# Patient Record
Sex: Male | Born: 1950 | Race: White | Hispanic: No | Smoking: Former smoker
Health system: Southern US, Community
[De-identification: ages and names within clinical notes are randomized; demographics above are authoritative.]

## PROBLEM LIST (undated history)

## (undated) DIAGNOSIS — I252 Old myocardial infarction: Secondary | ICD-10-CM

## (undated) DIAGNOSIS — I951 Orthostatic hypotension: Secondary | ICD-10-CM

## (undated) DIAGNOSIS — I472 Ventricular tachycardia, unspecified: Secondary | ICD-10-CM

## (undated) DIAGNOSIS — I10 Essential (primary) hypertension: Secondary | ICD-10-CM

## (undated) DIAGNOSIS — I219 Acute myocardial infarction, unspecified: Secondary | ICD-10-CM

## (undated) DIAGNOSIS — R911 Solitary pulmonary nodule: Secondary | ICD-10-CM

## (undated) DIAGNOSIS — I739 Peripheral vascular disease, unspecified: Secondary | ICD-10-CM

## (undated) DIAGNOSIS — I255 Ischemic cardiomyopathy: Secondary | ICD-10-CM

## (undated) DIAGNOSIS — Z720 Tobacco use: Secondary | ICD-10-CM

## (undated) DIAGNOSIS — E785 Hyperlipidemia, unspecified: Secondary | ICD-10-CM

## (undated) DIAGNOSIS — J449 Chronic obstructive pulmonary disease, unspecified: Secondary | ICD-10-CM

## (undated) DIAGNOSIS — I2510 Atherosclerotic heart disease of native coronary artery without angina pectoris: Secondary | ICD-10-CM

## (undated) HISTORY — DX: Old myocardial infarction: I25.2

## (undated) HISTORY — DX: Acute myocardial infarction, unspecified: I21.9

## (undated) HISTORY — DX: Ventricular tachycardia, unspecified: I47.20

## (undated) HISTORY — DX: Tobacco use: Z72.0

## (undated) HISTORY — DX: Peripheral vascular disease, unspecified: I73.9

## (undated) HISTORY — DX: Ischemic cardiomyopathy: I25.5

## (undated) HISTORY — DX: Orthostatic hypotension: I95.1

## (undated) HISTORY — DX: Ventricular tachycardia: I47.2

## (undated) HISTORY — PX: CORONARY STENT PLACEMENT: SHX1402

## (undated) HISTORY — DX: Solitary pulmonary nodule: R91.1

## (undated) HISTORY — DX: Atherosclerotic heart disease of native coronary artery without angina pectoris: I25.10

## (undated) HISTORY — DX: Essential (primary) hypertension: I10

## (undated) HISTORY — DX: Hyperlipidemia, unspecified: E78.5

---

## 2002-02-17 HISTORY — PX: ANGIOPLASTY: SHX39

## 2002-12-04 ENCOUNTER — Inpatient Hospital Stay (HOSPITAL_COMMUNITY): Admission: EM | Admit: 2002-12-04 | Discharge: 2002-12-08 | Payer: Self-pay | Admitting: Cardiology

## 2003-12-25 ENCOUNTER — Ambulatory Visit: Payer: Self-pay | Admitting: Family Medicine

## 2004-01-02 ENCOUNTER — Ambulatory Visit: Payer: Self-pay | Admitting: *Deleted

## 2004-02-05 ENCOUNTER — Ambulatory Visit: Payer: Self-pay | Admitting: Cardiology

## 2004-02-27 ENCOUNTER — Ambulatory Visit: Payer: Self-pay | Admitting: Family Medicine

## 2004-05-28 ENCOUNTER — Ambulatory Visit: Payer: Self-pay | Admitting: Family Medicine

## 2007-02-15 ENCOUNTER — Encounter: Payer: Self-pay | Admitting: Cardiology

## 2007-05-22 ENCOUNTER — Encounter: Payer: Self-pay | Admitting: Pulmonary Disease

## 2007-05-27 ENCOUNTER — Ambulatory Visit: Payer: Self-pay | Admitting: Cardiology

## 2007-05-27 ENCOUNTER — Encounter: Payer: Self-pay | Admitting: Cardiology

## 2007-05-31 ENCOUNTER — Ambulatory Visit: Payer: Self-pay | Admitting: Cardiology

## 2007-05-31 ENCOUNTER — Encounter: Payer: Self-pay | Admitting: Pulmonary Disease

## 2007-06-23 ENCOUNTER — Ambulatory Visit: Payer: Self-pay | Admitting: Cardiology

## 2007-06-30 ENCOUNTER — Ambulatory Visit: Payer: Self-pay | Admitting: Cardiology

## 2007-07-06 ENCOUNTER — Encounter: Payer: Self-pay | Admitting: Pulmonary Disease

## 2007-07-21 ENCOUNTER — Ambulatory Visit: Payer: Self-pay | Admitting: Cardiology

## 2007-07-23 ENCOUNTER — Ambulatory Visit: Payer: Self-pay | Admitting: Cardiology

## 2007-07-26 ENCOUNTER — Encounter: Payer: Self-pay | Admitting: Cardiology

## 2007-07-27 ENCOUNTER — Ambulatory Visit: Payer: Self-pay | Admitting: Pulmonary Disease

## 2007-07-27 DIAGNOSIS — J984 Other disorders of lung: Secondary | ICD-10-CM | POA: Insufficient documentation

## 2007-07-27 DIAGNOSIS — J45909 Unspecified asthma, uncomplicated: Secondary | ICD-10-CM | POA: Insufficient documentation

## 2007-07-27 DIAGNOSIS — J449 Chronic obstructive pulmonary disease, unspecified: Secondary | ICD-10-CM

## 2007-07-27 DIAGNOSIS — F17200 Nicotine dependence, unspecified, uncomplicated: Secondary | ICD-10-CM

## 2007-07-27 DIAGNOSIS — I10 Essential (primary) hypertension: Secondary | ICD-10-CM | POA: Insufficient documentation

## 2007-07-27 DIAGNOSIS — I219 Acute myocardial infarction, unspecified: Secondary | ICD-10-CM | POA: Insufficient documentation

## 2007-07-28 ENCOUNTER — Ambulatory Visit: Payer: Self-pay | Admitting: Pulmonary Disease

## 2007-07-29 ENCOUNTER — Ambulatory Visit (HOSPITAL_COMMUNITY): Admission: RE | Admit: 2007-07-29 | Discharge: 2007-07-29 | Payer: Self-pay | Admitting: Cardiology

## 2007-07-29 ENCOUNTER — Inpatient Hospital Stay (HOSPITAL_BASED_OUTPATIENT_CLINIC_OR_DEPARTMENT_OTHER): Admission: RE | Admit: 2007-07-29 | Discharge: 2007-07-29 | Payer: Self-pay | Admitting: Cardiology

## 2007-07-29 ENCOUNTER — Ambulatory Visit: Payer: Self-pay | Admitting: Cardiology

## 2007-08-03 ENCOUNTER — Ambulatory Visit: Payer: Self-pay | Admitting: Cardiology

## 2007-08-04 ENCOUNTER — Telehealth (INDEPENDENT_AMBULATORY_CARE_PROVIDER_SITE_OTHER): Payer: Self-pay | Admitting: *Deleted

## 2007-11-14 ENCOUNTER — Encounter: Payer: Self-pay | Admitting: Cardiology

## 2007-11-19 ENCOUNTER — Emergency Department (HOSPITAL_COMMUNITY): Admission: EM | Admit: 2007-11-19 | Discharge: 2007-11-19 | Payer: Self-pay | Admitting: Emergency Medicine

## 2007-11-19 ENCOUNTER — Encounter: Payer: Self-pay | Admitting: Cardiology

## 2007-11-22 ENCOUNTER — Ambulatory Visit: Payer: Self-pay | Admitting: Pulmonary Disease

## 2007-11-22 ENCOUNTER — Inpatient Hospital Stay (HOSPITAL_COMMUNITY): Admission: AD | Admit: 2007-11-22 | Discharge: 2007-11-27 | Payer: Self-pay | Admitting: Pulmonary Disease

## 2007-11-22 ENCOUNTER — Ambulatory Visit: Payer: Self-pay | Admitting: Internal Medicine

## 2007-11-24 ENCOUNTER — Telehealth (INDEPENDENT_AMBULATORY_CARE_PROVIDER_SITE_OTHER): Payer: Self-pay | Admitting: *Deleted

## 2007-11-26 ENCOUNTER — Encounter: Payer: Self-pay | Admitting: Pulmonary Disease

## 2007-12-02 ENCOUNTER — Telehealth (INDEPENDENT_AMBULATORY_CARE_PROVIDER_SITE_OTHER): Payer: Self-pay | Admitting: *Deleted

## 2007-12-03 ENCOUNTER — Ambulatory Visit: Payer: Self-pay | Admitting: Pulmonary Disease

## 2007-12-03 DIAGNOSIS — J441 Chronic obstructive pulmonary disease with (acute) exacerbation: Secondary | ICD-10-CM

## 2007-12-22 ENCOUNTER — Ambulatory Visit: Payer: Self-pay | Admitting: Pulmonary Disease

## 2007-12-22 DIAGNOSIS — F411 Generalized anxiety disorder: Secondary | ICD-10-CM | POA: Insufficient documentation

## 2007-12-23 ENCOUNTER — Encounter: Payer: Self-pay | Admitting: Pulmonary Disease

## 2008-01-19 ENCOUNTER — Encounter: Payer: Self-pay | Admitting: Cardiology

## 2008-01-19 ENCOUNTER — Ambulatory Visit: Payer: Self-pay | Admitting: Cardiology

## 2008-01-19 DIAGNOSIS — I2510 Atherosclerotic heart disease of native coronary artery without angina pectoris: Secondary | ICD-10-CM

## 2008-01-19 DIAGNOSIS — I739 Peripheral vascular disease, unspecified: Secondary | ICD-10-CM | POA: Insufficient documentation

## 2008-03-06 ENCOUNTER — Encounter: Payer: Self-pay | Admitting: Pulmonary Disease

## 2008-03-23 ENCOUNTER — Ambulatory Visit: Payer: Self-pay | Admitting: Internal Medicine

## 2008-04-04 ENCOUNTER — Telehealth (INDEPENDENT_AMBULATORY_CARE_PROVIDER_SITE_OTHER): Payer: Self-pay | Admitting: *Deleted

## 2008-04-05 ENCOUNTER — Ambulatory Visit: Payer: Self-pay | Admitting: Internal Medicine

## 2008-04-12 ENCOUNTER — Telehealth: Payer: Self-pay | Admitting: Pulmonary Disease

## 2008-04-27 ENCOUNTER — Ambulatory Visit: Payer: Self-pay | Admitting: Cardiovascular Disease

## 2008-05-04 ENCOUNTER — Ambulatory Visit: Payer: Self-pay | Admitting: Pulmonary Disease

## 2008-05-08 ENCOUNTER — Telehealth (INDEPENDENT_AMBULATORY_CARE_PROVIDER_SITE_OTHER): Payer: Self-pay | Admitting: *Deleted

## 2008-07-26 ENCOUNTER — Telehealth (INDEPENDENT_AMBULATORY_CARE_PROVIDER_SITE_OTHER): Payer: Self-pay | Admitting: *Deleted

## 2008-09-01 ENCOUNTER — Ambulatory Visit: Payer: Self-pay | Admitting: Cardiology

## 2008-09-25 ENCOUNTER — Ambulatory Visit: Payer: Self-pay | Admitting: Pulmonary Disease

## 2008-10-20 ENCOUNTER — Encounter: Payer: Self-pay | Admitting: Cardiology

## 2008-10-24 ENCOUNTER — Encounter: Payer: Self-pay | Admitting: Pulmonary Disease

## 2008-10-25 ENCOUNTER — Telehealth: Payer: Self-pay | Admitting: Pulmonary Disease

## 2008-11-13 ENCOUNTER — Telehealth: Payer: Self-pay | Admitting: Adult Health

## 2008-11-20 ENCOUNTER — Ambulatory Visit: Payer: Self-pay | Admitting: Cardiology

## 2008-11-20 DIAGNOSIS — I714 Abdominal aortic aneurysm, without rupture, unspecified: Secondary | ICD-10-CM | POA: Insufficient documentation

## 2008-11-22 ENCOUNTER — Encounter: Payer: Self-pay | Admitting: Cardiology

## 2009-01-01 ENCOUNTER — Encounter (INDEPENDENT_AMBULATORY_CARE_PROVIDER_SITE_OTHER): Payer: Self-pay | Admitting: *Deleted

## 2009-01-22 ENCOUNTER — Telehealth (INDEPENDENT_AMBULATORY_CARE_PROVIDER_SITE_OTHER): Payer: Self-pay | Admitting: *Deleted

## 2009-01-22 ENCOUNTER — Telehealth: Payer: Self-pay | Admitting: Pulmonary Disease

## 2009-01-23 ENCOUNTER — Ambulatory Visit: Payer: Self-pay | Admitting: Internal Medicine

## 2009-02-19 ENCOUNTER — Encounter: Payer: Self-pay | Admitting: Pulmonary Disease

## 2009-02-22 ENCOUNTER — Ambulatory Visit: Payer: Self-pay | Admitting: Pulmonary Disease

## 2009-02-23 ENCOUNTER — Telehealth (INDEPENDENT_AMBULATORY_CARE_PROVIDER_SITE_OTHER): Payer: Self-pay | Admitting: *Deleted

## 2009-02-27 ENCOUNTER — Encounter: Payer: Self-pay | Admitting: Cardiology

## 2009-03-02 ENCOUNTER — Telehealth: Payer: Self-pay | Admitting: Pulmonary Disease

## 2009-03-06 ENCOUNTER — Encounter (INDEPENDENT_AMBULATORY_CARE_PROVIDER_SITE_OTHER): Payer: Self-pay | Admitting: *Deleted

## 2009-03-09 ENCOUNTER — Ambulatory Visit: Payer: Self-pay | Admitting: Cardiology

## 2009-03-13 ENCOUNTER — Encounter: Payer: Self-pay | Admitting: Pulmonary Disease

## 2009-03-16 ENCOUNTER — Ambulatory Visit: Payer: Self-pay | Admitting: Pulmonary Disease

## 2009-03-22 ENCOUNTER — Telehealth (INDEPENDENT_AMBULATORY_CARE_PROVIDER_SITE_OTHER): Payer: Self-pay | Admitting: *Deleted

## 2009-04-23 ENCOUNTER — Telehealth (INDEPENDENT_AMBULATORY_CARE_PROVIDER_SITE_OTHER): Payer: Self-pay | Admitting: *Deleted

## 2009-04-24 ENCOUNTER — Telehealth (INDEPENDENT_AMBULATORY_CARE_PROVIDER_SITE_OTHER): Payer: Self-pay | Admitting: *Deleted

## 2009-06-11 ENCOUNTER — Ambulatory Visit: Payer: Self-pay | Admitting: Cardiology

## 2009-06-19 ENCOUNTER — Telehealth: Payer: Self-pay | Admitting: Pulmonary Disease

## 2009-06-21 ENCOUNTER — Ambulatory Visit: Payer: Self-pay | Admitting: Pulmonary Disease

## 2009-06-28 ENCOUNTER — Ambulatory Visit (HOSPITAL_COMMUNITY): Admission: RE | Admit: 2009-06-28 | Discharge: 2009-06-28 | Payer: Self-pay | Admitting: Pulmonary Disease

## 2009-07-06 ENCOUNTER — Ambulatory Visit: Payer: Self-pay | Admitting: Pulmonary Disease

## 2009-07-31 ENCOUNTER — Ambulatory Visit: Payer: Self-pay | Admitting: Cardiology

## 2009-08-21 ENCOUNTER — Telehealth (INDEPENDENT_AMBULATORY_CARE_PROVIDER_SITE_OTHER): Payer: Self-pay | Admitting: *Deleted

## 2009-08-23 ENCOUNTER — Encounter: Payer: Self-pay | Admitting: Pulmonary Disease

## 2009-09-25 ENCOUNTER — Ambulatory Visit: Payer: Self-pay | Admitting: Pulmonary Disease

## 2009-09-25 ENCOUNTER — Telehealth (INDEPENDENT_AMBULATORY_CARE_PROVIDER_SITE_OTHER): Payer: Self-pay | Admitting: *Deleted

## 2009-10-02 ENCOUNTER — Telehealth (INDEPENDENT_AMBULATORY_CARE_PROVIDER_SITE_OTHER): Payer: Self-pay | Admitting: *Deleted

## 2009-11-05 ENCOUNTER — Ambulatory Visit: Payer: Self-pay | Admitting: Pulmonary Disease

## 2009-11-26 IMAGING — CT CT ANGIO CHEST
2 of 7 series · 19 of 36 positions shown · IV contrast (APPLIED)
Comparison: Chest radiograph 11/22/2007 and 11/19/2007.

CLINICAL DATA: Worsening shortness of breath.  COPD.  Cough and
wheezing.  History pulmonary nodules.  The

CT ANGIOGRAPHY CHEST
TECHNIQUE: Multidetector CT imaging of the chest using the
standard protocol during bolus administration of intravenous
contrast. Multiplanar reconstructed images including MIPs were
obtained and reviewed to evaluate the vascular anatomy.
Contrast:  68 ml Omnipaque 300

[Series 9: pulm embolism 1.0 b25f thins · axial · 0.64mm/px · z∈[-259,+14]mm · 18 of 305 slices shown]
[im 16/305  lung]
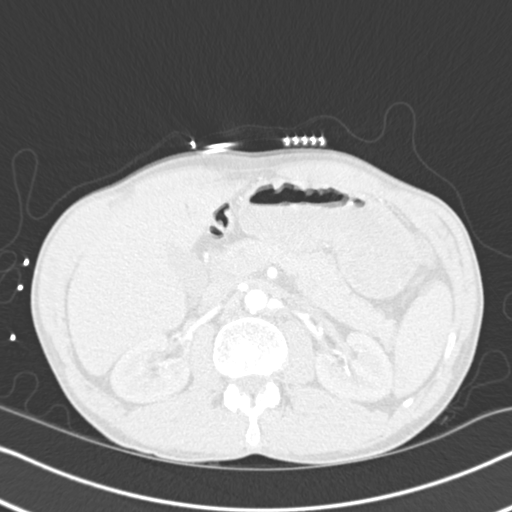
[im 31/305  mediastinal]
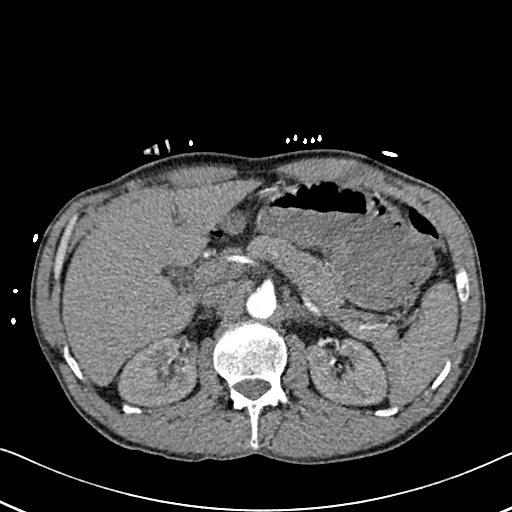
[im 46/305  lung]
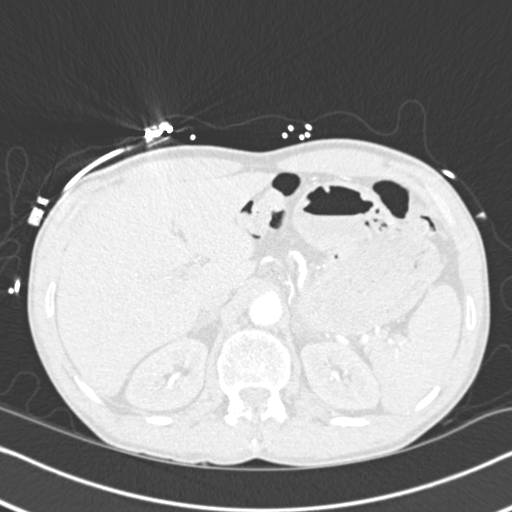
[im 61/305  mediastinal]
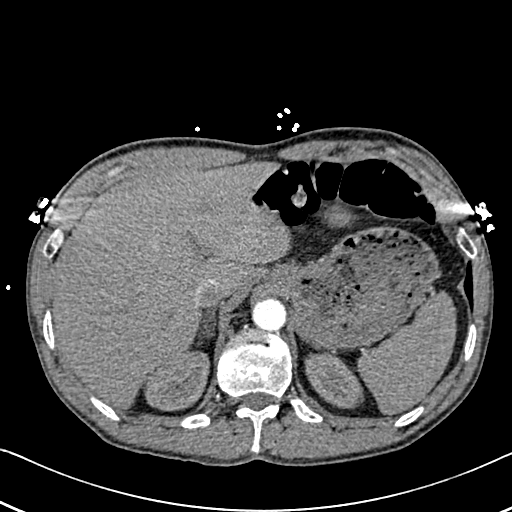
[im 77/305  lung]
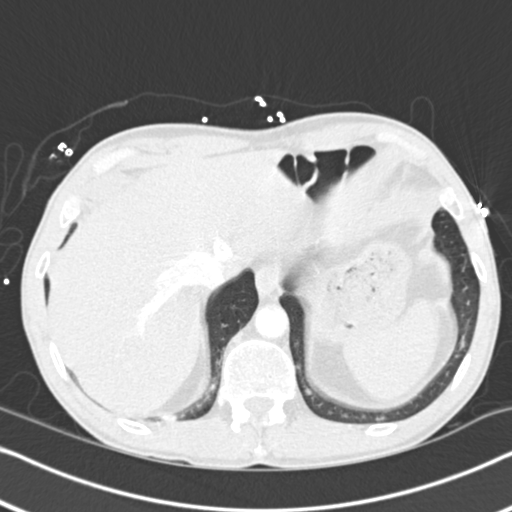
[im 92/305  mediastinal]
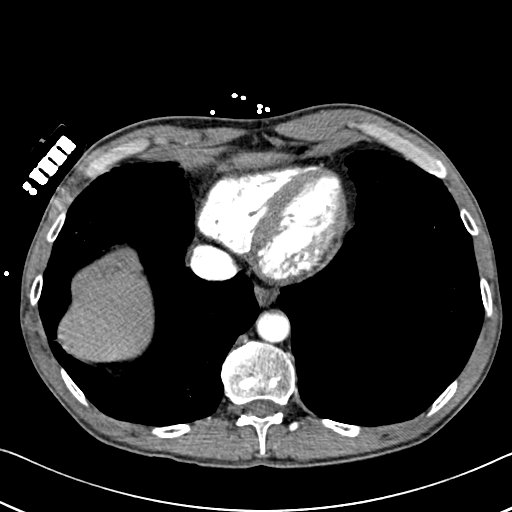
[im 107/305  lung]
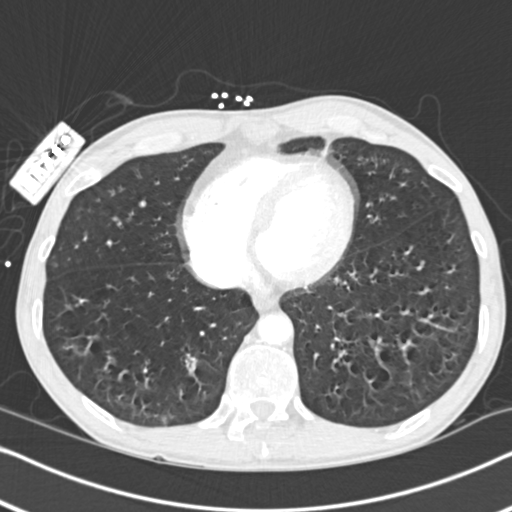
[im 122/305  mediastinal]
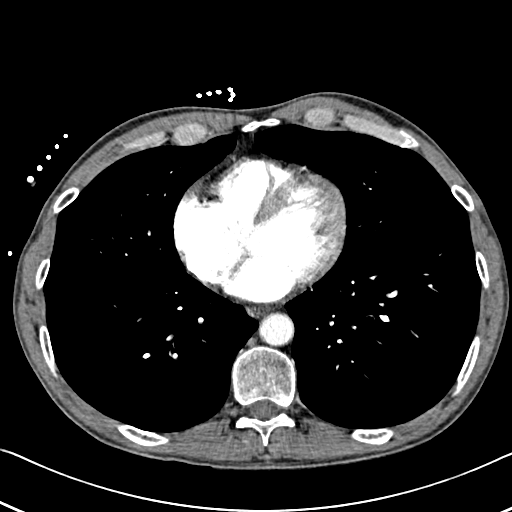
[im 137/305  lung]
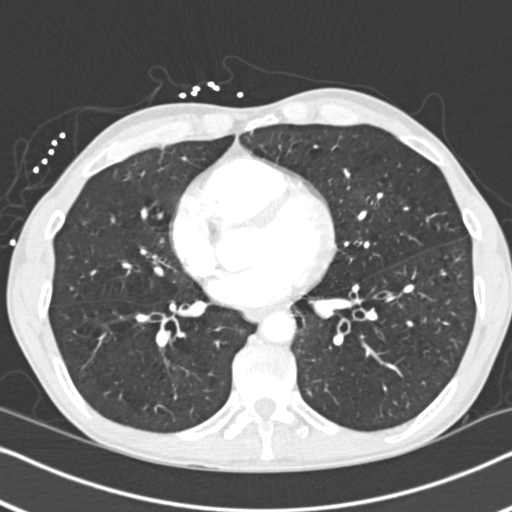
[im 168/305  mediastinal]
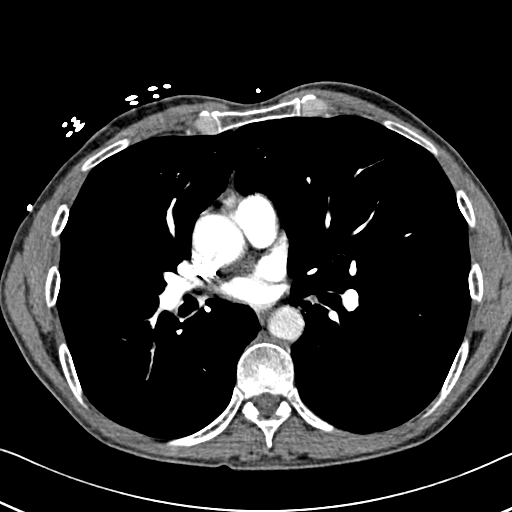
[im 183/305  lung]
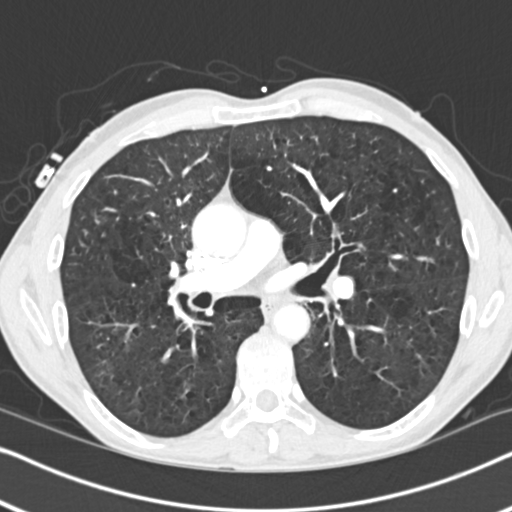
[im 198/305  mediastinal]
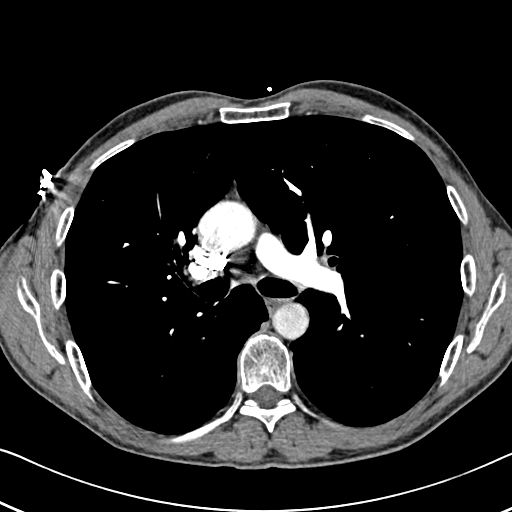
[im 213/305  lung]
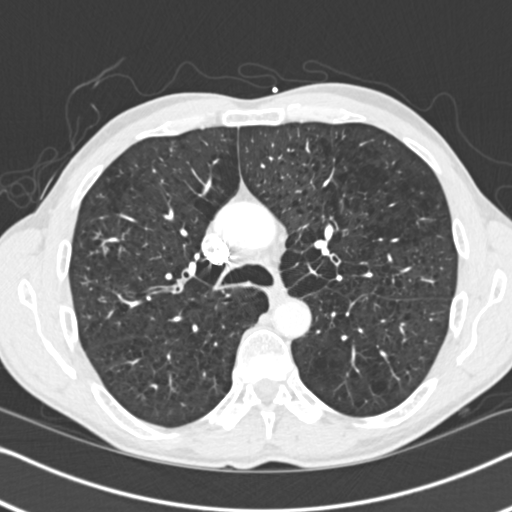
[im 229/305  mediastinal]
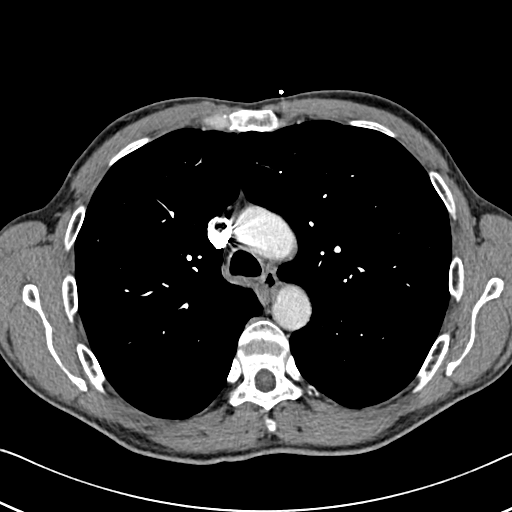
[im 244/305  lung]
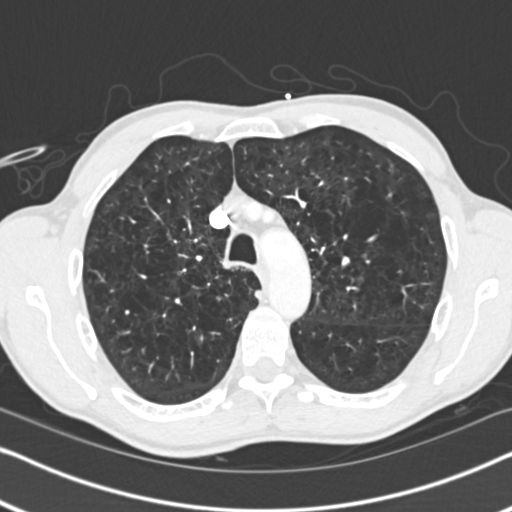
[im 259/305  mediastinal]
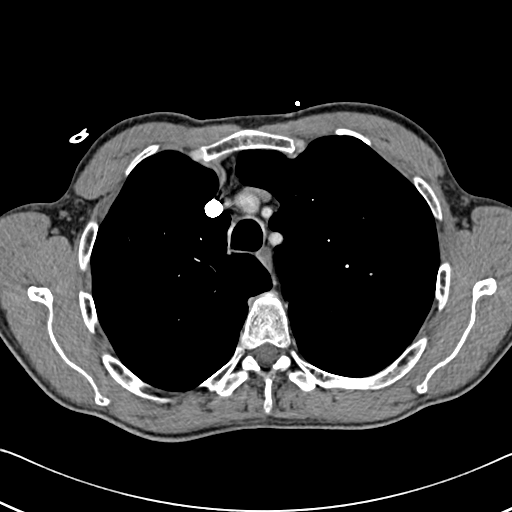
[im 274/305  lung]
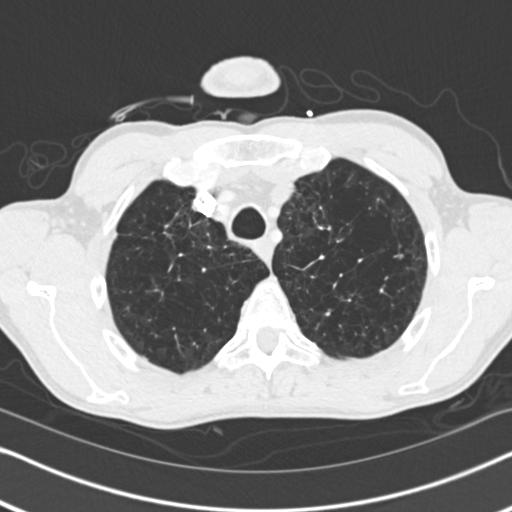
[im 289/305  mediastinal]
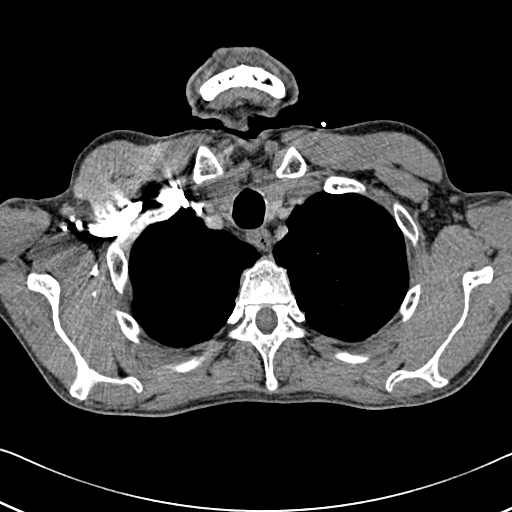

[Series 10: pulm embolism 2.0 cor · coronal · 0.64mm/px · 1 of 108 slices shown]
[im 54/108  mediastinal]
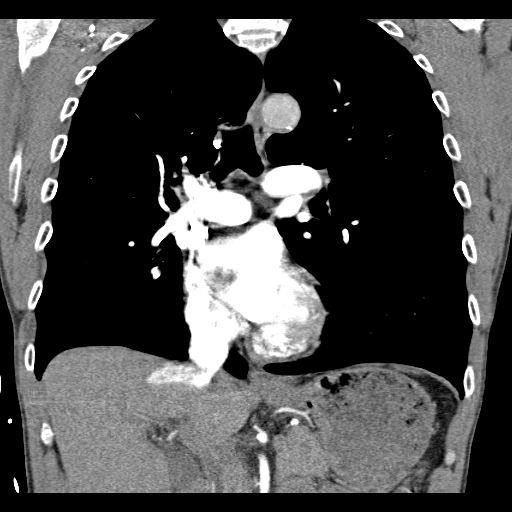

[19 of 36 positions shown; findings below may reference images not displayed]

FINDINGS: There is excellent opacification of the pulmonary
arterial system.  There are no focal filling defects are present to
suggest pulmonary embolus.  Heart size is normal.  There is no
significant pleural or pericardial effusion.  Limited imaging of
the upper abdomen is unremarkable.  Changes of centrilobular
emphysema are noted bilaterally.  There is some breathing motion at
the inferior aspect of the study.  Minimal nodularity at the
inferior aspect of the right upper lobe with a tree in bud
configuration and a similar pattern in the right lower lobe suggest
the possibility of early infection or inflammation.  No focal
disease is evident on the left.
IMPRESSION: 1.  No pulmonary embolus.
2.  Changes of moderate to severe COPD.
3.  Peripheral micronodularity in a "tree in bud" configuration
suggesting early infection or inflammation.

## 2009-11-26 IMAGING — CR DG CHEST 2V
2 series · 2 of 2 positions shown · non-contrast
Comparison: 11/19/2007

CLINICAL DATA: COPD

CHEST - 2 VIEW

[w chest pa]
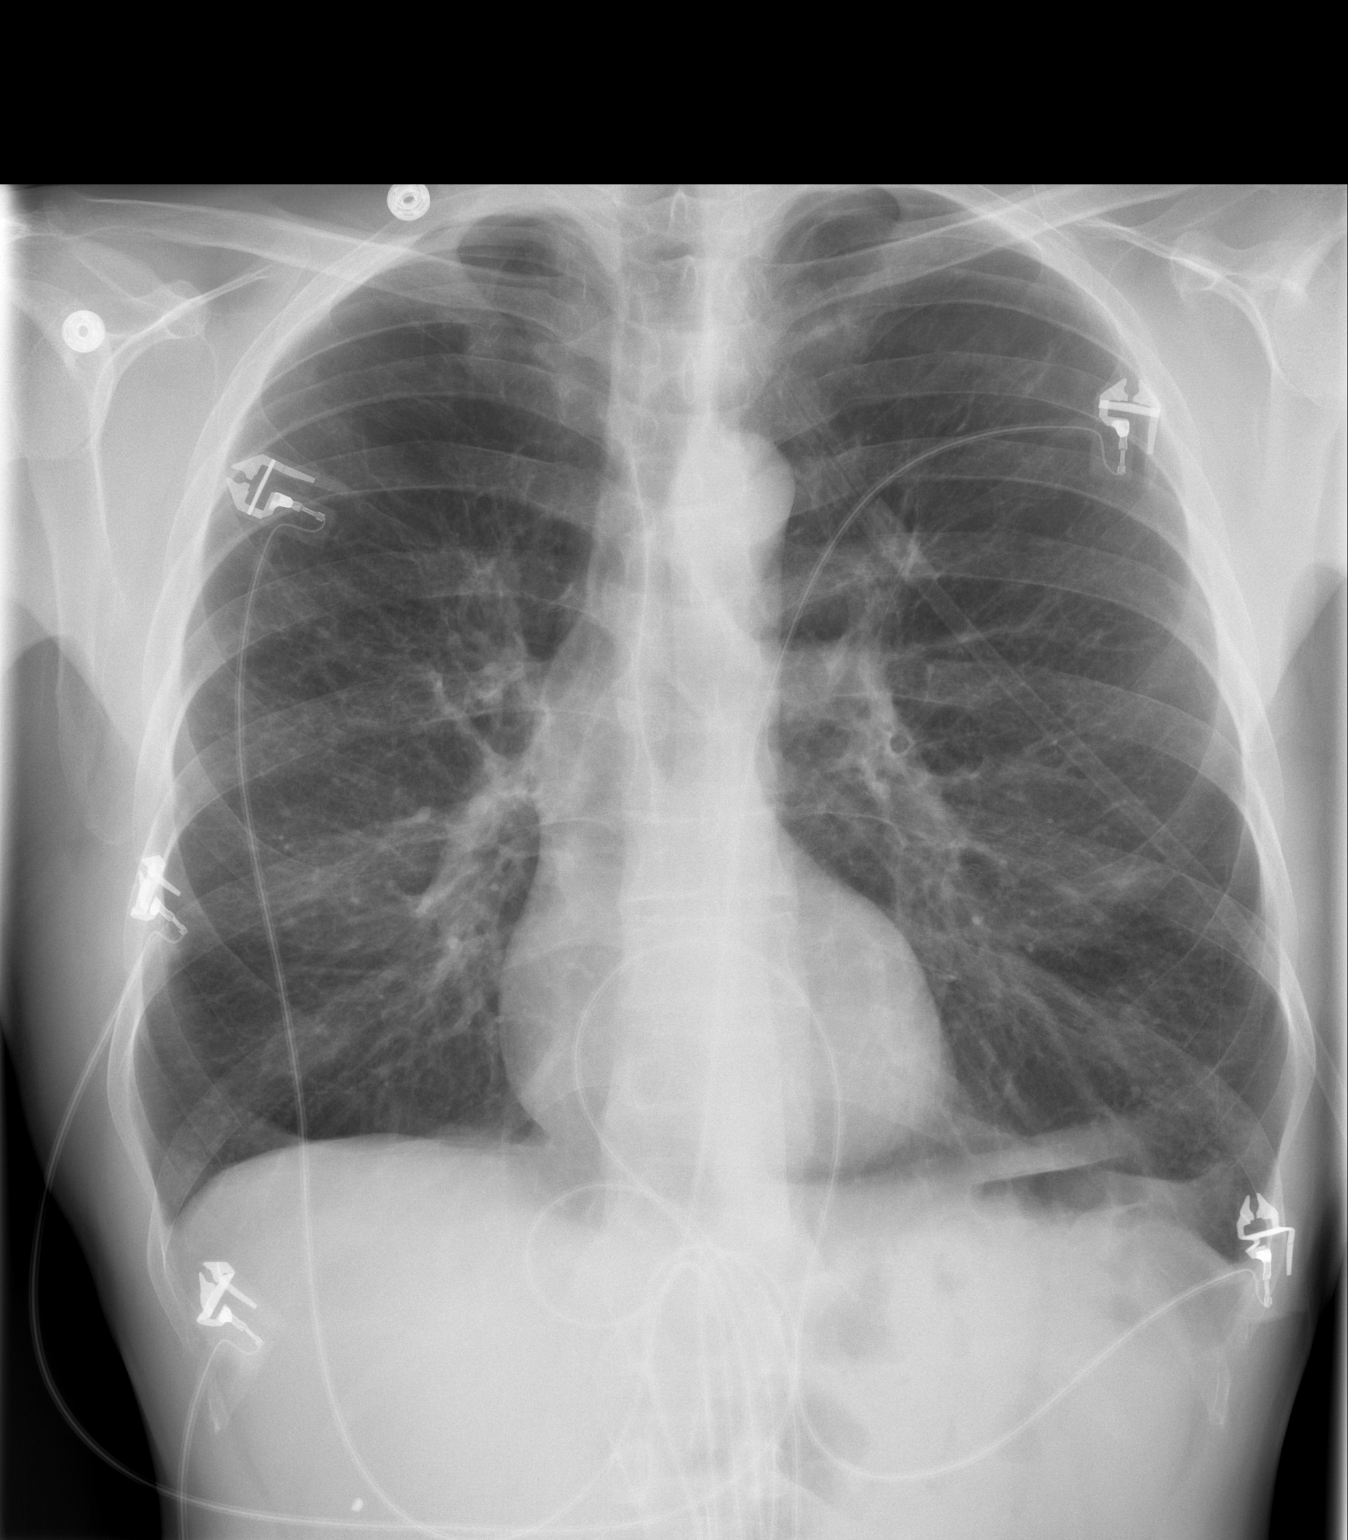

[w chest lat]
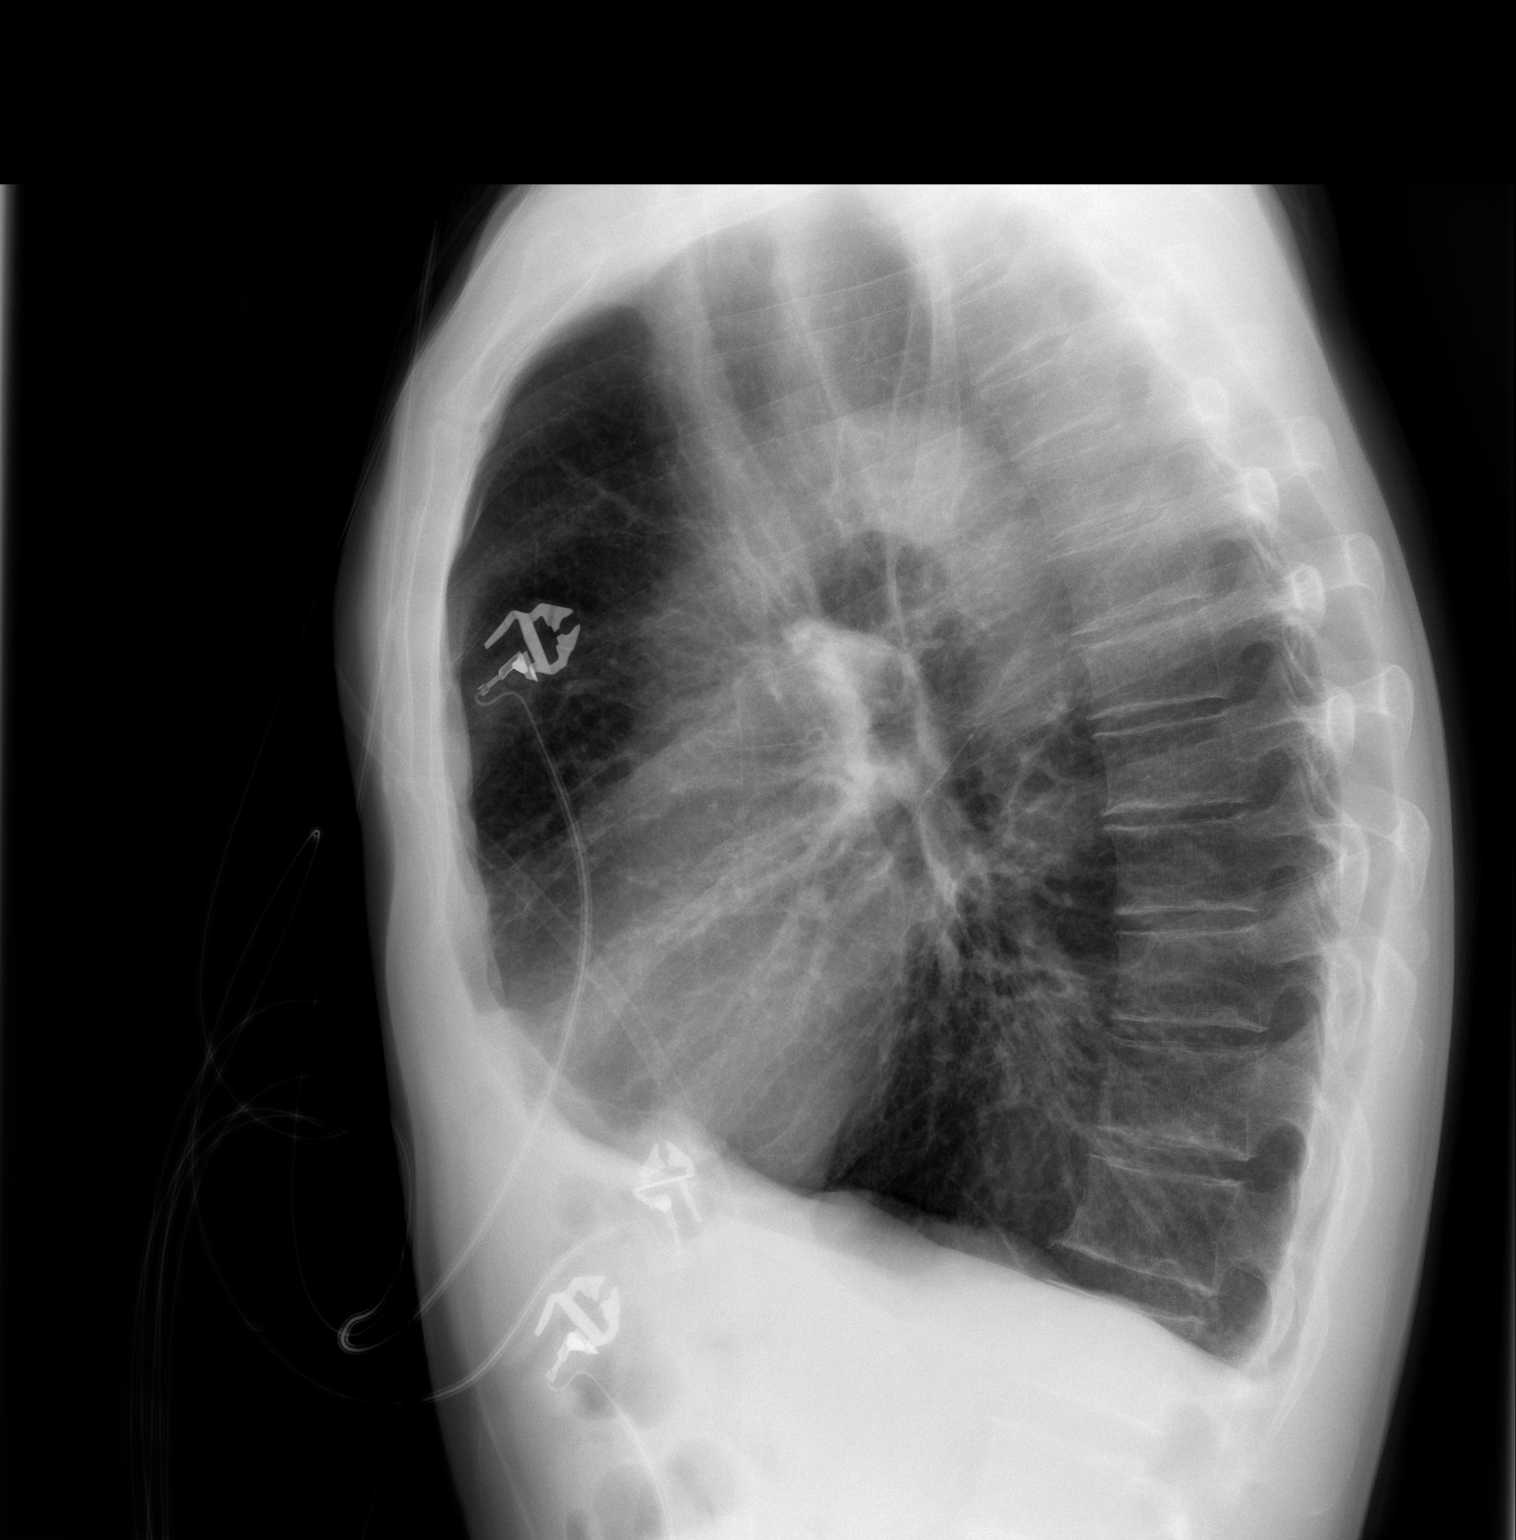

[2 of 2 positions shown; findings below may reference images not displayed]

FINDINGS: The cardiomediastinal silhouette is stable.  No acute
infiltrate or edema.  Hyperinflation again noted.  Left basilar
scarring is stable.  Mild central increased bronchial markings
stable from prior exam.
IMPRESSION: No acute disease.  Stable hyperinflation.

## 2009-12-18 ENCOUNTER — Ambulatory Visit: Payer: Self-pay | Admitting: Cardiovascular Disease

## 2009-12-19 ENCOUNTER — Encounter: Payer: Self-pay | Admitting: Pulmonary Disease

## 2009-12-24 ENCOUNTER — Ambulatory Visit: Payer: Self-pay | Admitting: Pulmonary Disease

## 2010-01-11 ENCOUNTER — Ambulatory Visit: Payer: Self-pay | Admitting: Cardiology

## 2010-02-20 ENCOUNTER — Telehealth (INDEPENDENT_AMBULATORY_CARE_PROVIDER_SITE_OTHER): Payer: Self-pay | Admitting: *Deleted

## 2010-03-09 ENCOUNTER — Encounter: Payer: Self-pay | Admitting: Family Medicine

## 2010-03-14 ENCOUNTER — Other Ambulatory Visit: Payer: Self-pay | Admitting: Pulmonary Disease

## 2010-03-14 DIAGNOSIS — R911 Solitary pulmonary nodule: Secondary | ICD-10-CM

## 2010-03-19 NOTE — Progress Notes (Signed)
Summary: prescript  Phone Note From Pharmacy Call back at [phone]   Caller: [facility]* Call For: alva  Summary of Call: need to verify change in albertol prescript Initial call taken by: Rickard Patience,  April 23, 2009 3:38 PM  Follow-up for Phone Call        The wrong albuterol sulfate dosage sent to pharmacy. We will correct this on the pt's med list. Pharmacy is aware this should be 0.083 albuterol neb sol. Follow-up by: Michel Bickers CMA,  April 23, 2009 4:14 PM    New/Updated Medications: ALBUTEROL SULFATE (2.5 MG/3ML) 0.083% NEBU (ALBUTEROL SULFATE) 1 vial 4-5 times daily in nebulizer

## 2010-03-19 NOTE — Progress Notes (Signed)
Summary: albuterol prescription  Phone Note Call from Patient   Caller: Patient Call For: Aaron [name] Summary of Call: need refill for albuterol [facility] Initial call taken by: Rickard Patience,  April 24, 2009 9:37 AM  Follow-up for Phone Call        see phone note from yesterday ( 04-23-2009) rx already ready at [facility] for pt to pick up.   LMOM informing pt rx sent to pharmacy. Arman Filter LPN  April 24, 1608 9:50 AM

## 2010-03-19 NOTE — Progress Notes (Signed)
Summary: still sob   [id number]  Phone Note Call from Patient Call back at [phone]   Caller: Patient Call For: Vassie Loll - Tammy P Reason for Call: Talk to Nurse Summary of Call: still has wheezing and rattling in chest, sob, seems to have gotten a little better but still bad.  Please advise Initial call taken by: Eugene Gavia,  October 02, 2009 9:42 AM  Follow-up for Phone Call        [facility].  Aundra Millet Reynolds LPN  October 02, 2009 9:45 AM   pt called back.  pt was just seen by TP for a sick visit on 09-25-2009.  Pt states he finished abx and pred taper is is "no better."  Pt c/o sob with exertion, tightness in chest and coughing up clear to brown colored sputum.  pt denied fever, chills, sweats, nausea, vomiting.  pt states he "usually gets a stronger course of pred taper" which helps his breathing.  Will forward message to RA to address.  Aundra Millet Reynolds LPN  October 02, 2009 4:32 PM   Additional Follow-up for Phone Call Additional follow up Details #1::        Tammy, can you pl address - OK for longer steroid taper - stay at 40 x 5 ds, then slow taper  prednisone 10mg  #60 40mg  once daily x 5 days, 30 once daily x5 days, 20mg  once daily x 5 days,  10mg  once daily x 5days, then stop  Please contact office for sooner follow up if symptoms do not improve or worsen  Additional Follow-up by: Comer Locket. Vassie Loll MD,  October 02, 2009 4:37 PM    Additional Follow-up for Phone Call Additional follow up Details #2::    rx sent to pharmacy.  pt aware.  Aundra Millet Reynolds LPN  October 02, 2009 5:04 PM   New/Updated Medications: PREDNISONE 10 MG TABS (PREDNISONE) 4 tabs daily x 5 days, 3 tabs daily x 5 days, 2 tabs daily x 5 days, 1 tab daily x 5 day then stop. Prescriptions: PREDNISONE 10 MG TABS (PREDNISONE) 4 tabs daily x 5 days, 3 tabs daily x 5 days, 2 tabs daily x 5 days, 1 tab daily x 5 day then stop.  #60 x 0   Entered by:   Arman Filter LPN   Authorized by:   Comer Locket. Vassie Loll MD   Signed by:   Arman Filter LPN on 45/40/9811   Method used:   Electronically to        [facility]* (retail)       [address]       [place], [place]  [address]       Ph: [phone]       Fax: [phone]   RxID:   [id number]

## 2010-03-19 NOTE — Assessment & Plan Note (Signed)
Summary: NP follow up - COPD   Copy to:  Dr.Degent Primary Provider/Referring Provider:  [facility]  CC:  1 month follow up - staes has improved .  History of Present Illness: 60/F, ex- smoker for FU of pulm nodules & COPD Gold stg 3 -FEV1 31% 1/11  CT chest>> stable rt nodules, new LUL 3.1 cm patchy density, 8mm new lUL subpleural nodule 4/11 1.6 cm LUL spiculation, PET SUV 2.0 On xanax for anxiety. 2 acute visits in dec 2010.  March 16, 2009 2:25 PM  TP changed lisinopril to benicar. Went to ER in [place] because of "panic attacks."  states he is still having thick mucus that is really hard to get up. Given steroid course.  Jul 06, 2009 4:44 PM  -with daughter to discuss results of PET  Stable on same dose of xanax, off prednisone, c/o runny nose. Breathing close to baseline.   September 25, 2009--Presents for an acute office visit. Complains of increased SOB, prod cough with brown to clear mucus, wheezing x1week . cough is getting worse. Now getting up thick yellow and brown mucus.    November 05, 2009 --Presents for 1 month follow up - staes has improved. Last visit tx for bronchitic flare w/ Augmentin and steroid taper. He is back to baseline. Cough and congestion is resolved. No wheezing. Takes meds regularly. Denies chest pain,  orthopnea, hemoptysis, fever, n/v/d, edema, headache. Has upcoming follow up CT chest on 12/18/09 for lung nodules.   Medications Prior to Update: 1)  Plavix 75 Mg  Tabs (Clopidogrel Bisulfate) .... Take One Tablet By Mouth Once Daily 2)  Ventolin Hfa 108 (90 Base) Mcg/act  Aers (Albuterol Sulfate) .Marland Kitchen.. 1-2 Puffs Every 4-6 Hours As Needed 3)  Bayer Aspirin 325 Mg Tabs (Aspirin) .... Take 1 Tablet By Mouth Once A Day 4)  Advair Diskus 250-50 Mcg/dose  Misc (Fluticasone-Salmeterol) .... Inhale 1 Puff Twice Daily. Rinse Mouth After Use. 5)  Albuterol Sulfate (2.5 Mg/74ml) 0.083% Nebu (Albuterol Sulfate) .Marland Kitchen.. 1 Vial Two Times Daily in Nebulizer 6)   Benicar 20 Mg Tabs (Olmesartan Medoxomil) .... 1/2 Once Daily 7)  Bisoprolol Fumarate 5 Mg Tabs (Bisoprolol Fumarate) .... 1/2 Tab By Mouth Once Daily 8)  Oxygen .... 2l As Needed (Ahc) 9)  Alprazolam 0.5 Mg Tabs (Alprazolam) .... Take One Tablet Every 12 Hours As Needed 10)  Spiriva Handihaler 18 Mcg Caps (Tiotropium Bromide Monohydrate) .Marland Kitchen.. 1 Puff Once Daily 11)  Nitrostat 0.4 Mg Subl (Nitroglycerin) .Marland Kitchen.. 1 Tablet Under Tongue At Onset of Chest Pain; You May Repeat Every 5 Minutes For Up To 3 Doses. 12)  Red Yeast Rice 600 Mg Tabs (Red Yeast Rice Extract) .... Take 1 Tablet By Mouth Once A Day 13)  Cetirizine Hcl 10 Mg Tabs (Cetirizine Hcl) .... Take 1 Tablet By Mouth Once A Day As Needed Allergies 14)  Augmentin 875-125 Mg Tabs (Amoxicillin-Pot Clavulanate) .Marland Kitchen.. 1 By Mouth Two Times A Day 15)  Prednisone 10 Mg Tabs (Prednisone) .... 4 Tabs Daily X 5 Days, 3 Tabs Daily X 5 Days, 2 Tabs Daily X 5 Days, 1 Tab Daily X 5 Day Then Stop.  Current Medications (verified): 1)  Plavix 75 Mg  Tabs (Clopidogrel Bisulfate) .... Take One Tablet By Mouth Once Daily 2)  Ventolin Hfa 108 (90 Base) Mcg/act  Aers (Albuterol Sulfate) .Marland Kitchen.. 1-2 Puffs Every 4-6 Hours As Needed 3)  Bayer Aspirin 325 Mg Tabs (Aspirin) .... Take 1 Tablet By Mouth Once A Day 4)  Advair Diskus 250-50 Mcg/dose  Misc (Fluticasone-Salmeterol) .... Inhale 1 Puff Twice Daily. Rinse Mouth After Use. 5)  Albuterol Sulfate (2.5 Mg/13ml) 0.083% Nebu (Albuterol Sulfate) .Marland Kitchen.. 1 Vial Two Times Daily in Nebulizer 6)  Benicar 20 Mg Tabs (Olmesartan Medoxomil) .... 1/2 Once Daily 7)  Bisoprolol Fumarate 5 Mg Tabs (Bisoprolol Fumarate) .... 1/2 Tab By Mouth Once Daily 8)  Oxygen .... 2l As Needed (Ahc) 9)  Alprazolam 0.5 Mg Tabs (Alprazolam) .... Take One Tablet Every 12 Hours As Needed 10)  Spiriva Handihaler 18 Mcg Caps (Tiotropium Bromide Monohydrate) .Marland Kitchen.. 1 Puff Once Daily 11)  Nitrostat 0.4 Mg Subl (Nitroglycerin) .Marland Kitchen.. 1 Tablet Under Tongue At  Onset of Chest Pain; You May Repeat Every 5 Minutes For Up To 3 Doses. 12)  Red Yeast Rice 600 Mg Tabs (Red Yeast Rice Extract) .... Take 1 Tablet By Mouth Once A Day 13)  Cetirizine Hcl 10 Mg Tabs (Cetirizine Hcl) .... 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 Coronary artery disease severe single-vessel coronary arteries with mildly reduced ejection fraction EF 45% significant for her wall motion abnormality.  Chronically occluded right coronary artery.  1. Severe ischemic cardiomyopathy.     a.     EF 40-45% with inferior apical akinesis by 2-D echo, April      2009.     b.     EF 30-35% by cardiac catheterization in 2004.     c.     Status post NSTEMI/bare-metal stenting of proximal LAD,      October 2004.     d.     Residual, chronic 100% distal RCA stenosis.     e.     Status post MI/PCI in 1989; HSRA/stenting in 1997 [facility]). 2. History of nonsustained ventricular tachycardia.     a.     Noninducible electrophysiology study in 2004. 3. Severe COPD/bullous emphysema.     a.     Longstanding tobacco smoking. 4. Pulmonary nodules.     a.     Confirmed by recent follow up chest CT scan. 5. Peripheral vascular disease.     a.     LLE claudication with 0.90 ABI; 0.94 on the right. 6. History of orthostatic hypotension. 7. Dyslipidemia.   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  Patient states former smoker.   Risk Factors: Smoking Status: quit (09/25/2009) Packs/Day: <1/3 PPD (07/31/2009)  Past Pulmonary History:  Pulmonary History: 1. CXR at [facility] showed RUL nodule >> CT 6/09 showed multiple nodules as descibed  -of concern 7mm RUL with sub pleural scarring & 8mm LUL. >> stable on CT 3/10 but new nodules Lt lung 7/10 >> Stable right upper lobe and left upper lobe irregular nodular densities.  Previously noted left lower lobe density resolved.   1/11 >> stable rt nodules, new LUL 3.1 cm patchy density, 8mm new lUL subpleural nodule  2. severe COPD FEV1 31% 6/09,  DLCO 49%, high TLC c/w emphysema 10/09 COPD exacerbation >> hospitalised  - improved remarkably with steroids again 11/09 has quit smoking since d/c Vaccines uptodate  Review of Systems      See HPI  Vital Signs:  Patient profile:   60 year old male Height:      64 inches Weight:      121.38 pounds BMI:     20.91 O2 Sat:      97 % on Room air Temp:     97.9 degrees F oral Pulse rate:   71 / minute BP sitting:   96 / 66  (left arm) Cuff size:   regular  Vitals Entered By: Boone Master CNA/MA (November 05, 2009 9:36 AM)  O2 Flow:  Room air CC: 1 month follow up - staes has improved  Is Patient Diabetic? No Comments Medications reviewed with patient Daytime contact number verified with patient. Boone Master CNA/MA  November 05, 2009 9:34 AM    Physical Exam  Additional Exam:  Gen. Pleasant, thin male , in no distress ENT - no lesions, no post nasal drip Neck: No JVD, no thyromegaly, no carotid bruits Lungs: coarse BS w/ no wheezing Cardiovascular: Rhythm regular, heart sounds  normal, no murmurs or gallops, no peripheral edema Musculoskeletal: No deformities, no cyanosis or clubbing      Impression & Recommendations:  Problem # 1:  C O P D (ICD-496) Recent  exacerbation now resolved.  flu shot today  follow up 6 weeks Dr. Vassie Loll   Problem # 2:  PULMONARY NODULE (ICD-518.89)  due for follow up CT chest 12/18/09 ov after w/ Dr. Vassie Loll to discuss results.   Orders: Est. Patient Level II (04540)  Complete Medication List: 1)  Plavix 75 Mg Tabs (Clopidogrel bisulfate) .... Take one tablet by mouth once daily 2)  Ventolin Hfa 108 (90 Base) Mcg/act Aers (Albuterol sulfate) .Marland Kitchen.. 1-2 puffs every 4-6 hours as needed 3)  Bayer Aspirin 325 Mg Tabs (Aspirin) .... Take 1 tablet by mouth once a day 4)  Advair Diskus 250-50 Mcg/dose Misc (Fluticasone-salmeterol) .... Inhale 1 puff twice daily. rinse mouth after use. 5)  Albuterol Sulfate (2.5 Mg/50ml) 0.083% Nebu (Albuterol sulfate) .Marland Kitchen.. 1 vial two times daily in nebulizer 6)  Benicar 20 Mg Tabs (Olmesartan medoxomil) .... 1/2 once daily 7)  Bisoprolol Fumarate 5 Mg Tabs (Bisoprolol fumarate) .... 1/2 tab by mouth once daily 8)  Oxygen  .... 2l as needed (ahc) 9)  Alprazolam 0.5 Mg Tabs (Alprazolam) .... Take one tablet every 12 hours as needed 10)  Spiriva Handihaler 18 Mcg Caps (Tiotropium bromide monohydrate) .Marland Kitchen.. 1 puff once daily 11)  Nitrostat 0.4 Mg Subl (Nitroglycerin) .Marland Kitchen.. 1 tablet under tongue at onset of chest pain; you may repeat every 5 minutes for up to 3 doses. 12)  Red Yeast Rice 600 Mg Tabs (Red yeast rice extract) .... Take 1 tablet by mouth once a day 13)  Cetirizine Hcl 10 Mg Tabs (Cetirizine hcl) .... Take 1 tablet by mouth once a day as needed allergies  Patient Instructions: 1)  Flu shot today 2)  follow up 6 weeks -needs to be after 12/18/09.  3)  Continue on same meds.     Appended Document: flu vaccine documentation Flu Vaccine Consent Questions     Do you have a history of severe allergic reactions to this vaccine? no    Any prior history of allergic reactions to egg and/or gelatin? no    Do you have a  sensitivity to the preservative Thimersol? no    Do you have a past history  of Guillan-Barre Syndrome? no    Do you currently have an acute febrile illness? no    Have you ever had a severe reaction to latex? no    Vaccine information given and explained to patient? yes    Are you currently pregnant? no    Lot Number:[id number]   Exp Date:08/17/2010   Site Given  Left Deltoid IM  Boone Master CNA/MA  November 05, 2009 10:33 AM    Clinical Lists Changes  Orders: Added new Service order of Flu Vaccine 83yrs + MEDICARE PATIENTS (Z6109) - Signed Added new Service order of Administration Flu vaccine - MCR (U0454) - Signed Observations: Added new observation of FLU VAX VIS: 09/11/2009 version (11/05/2009 10:33) Added new observation of FLU VAXLOT: [id number] (11/05/2009 10:33) Added new observation of FLU VAXMFR: [employer] (11/05/2009 10:33) Added new observation of FLU VAX EXP: 08/17/2010 (11/05/2009 10:33) Added new observation of FLU VAX DSE: 0.65ml (11/05/2009 10:33) Added new observation of FLU VAX: Fluvax 3+ (11/05/2009 10:33)

## 2010-03-19 NOTE — Assessment & Plan Note (Signed)
Summary: rov 3-4 wks ///kp   Visit Type:  Follow-up Copy to:  Dr.Degent Primary Provider/Referring Provider:  [facility]  CC:  Pt here for follow up. Pt c/o S.O.B and but feels "much" better than a week ago. Pt states currently taking Prednisone 30mg . Pt c/o producitve cough with light green to brown mucus .  History of Present Illness: 58/F, ex- smoker for FU of pulm nodules & COPD Gold stg 3 ,  after recent exacerbation  1/11  CT chest>> stable rt nodules, new LUL 3.1 cm patchy density, 8mm new lUL subpleural nodule On xanax for anxiety. 2 acute visits in dec 2010.  March 16, 2009 2:25 PM  TP changed lisinopril to benicar. Went to ER in [place] because of "panic attacks."  states he is still having thick mucus that is really hard to get up. Inc rease prednisone to 40 mg , taper by 10 mg/wk.,  use mucinex for cough. Stable on same dose of xanax. c/o S.O.B, but feels "much" better than a week ago. Pt states currently taking Prednisone 30mg . Pt c/o producitve cough with light green to brown mucus  Denies chest pain, dyspnea, orthopnea, hemoptysis, fever, n/v/d, edema, headache.   Preventive Screening-Counseling & Management  Alcohol-Tobacco     Smoking Status: quit     Packs/Day: 1.0     Year Started: 1961     Year Quit: 2009  Allergies (verified): No Known Drug Allergies  Past History:  Past Medical History: Last updated: 11/20/2008 TOBACCO ABUSE (ICD-305.1) - quit 10/09 PULMONARY NODULE (ICD-518.89) C O P D (ICD-496) Hx of MYOCARDIAL INFARCTION (ICD-410.90) HYPERTENSION (ICD-401.9) ASTHMA (ICD-493.90)  peripheral vascular disease, distal aneurysm circumferential ending immediately above the iliac bifurcation, left iliac stenosis of 50%. Coronary artery disease severe single-vessel coronary arteries with mildly reduced ejection fraction EF 45% significant for her wall motion abnormality.  Chronically occluded right coronary artery.  1. Severe ischemic  cardiomyopathy.     a.     EF 40-45% with inferior apical akinesis by 2-D echo, April      2009.     b.     EF 30-35% by cardiac catheterization in 2004.     c.     Status post NSTEMI/bare-metal stenting of proximal LAD,      October 2004.     d.     Residual, chronic 100% distal RCA stenosis.     e.     Status post MI/PCI in 1989; HSRA/stenting in 1997 [facility]). 2. History of nonsustained ventricular tachycardia.     a.     Noninducible electrophysiology study in 2004. 3. Severe COPD/bullous emphysema.     a.     Longstanding tobacco smoking. 4. Pulmonary nodules.     a.     Confirmed by recent follow up chest CT scan. 5. Peripheral vascular disease.     a.     LLE claudication with 0.90 ABI; 0.94 on the right. 6. History of orthostatic hypotension. 7. Dyslipidemia.   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  Patient admits to prior drug use.   Past Pulmonary History:  Pulmonary History: 1. CXR at [facility] showed RUL nodule >> CT 6/09 showed multiple nodules as descibed  -of concern 7mm RUL with sub pleural scarring & 8mm LUL. >> stable on CT 3/10 but new nodules Lt lung 7/10 >> Stable right upper lobe and left upper lobe irregular nodular densities.  Previously noted left lower lobe density resolved.   1/11 >> stable rt nodules, new LUL 3.1 cm patchy density, 8mm new lUL subpleural nodule  2. severe COPD FEV1 31% 6/09, DLCO 49%, high TLC c/w emphysema Describes chest cold in 12/08 requiring prednisone - made him feel like a 'different person'. 10/09 COPD exacerbation >> hospitalised  - improved remarkably with steroids again 11/09 has quit smoking since d/c Vaccines uptodate  3. Ischemic cardiomyopathy with EF 30-35% & high grade proximal lesion of LAD 2004 requiring stent, now further intervention planned.  Social History: Smoking Status:  quit Packs/Day:  1.0  Review of  Systems       The patient complains of dyspnea on exertion.  The patient denies anorexia, fever, weight loss, weight gain, vision loss, decreased hearing, hoarseness, chest pain, syncope, peripheral edema, prolonged cough, headaches, hemoptysis, abdominal pain, melena, hematochezia, severe indigestion/heartburn, hematuria, muscle weakness, difficulty walking, depression, unusual weight change, and abnormal bleeding.    Vital Signs:  Patient profile:   60 year old male Height:      64 inches Weight:      115.38 pounds O2 Sat:      97 % on Room air Temp:     98.4 degrees F oral Pulse rate:   80 / minute BP sitting:   122 / 76  (right arm) Cuff size:   regular  Vitals Entered By: Zackery Barefoot CMA (March 16, 2009 2:07 PM)  O2 Flow:  Room air CC: Pt here for follow up. Pt c/o S.O.B, but feels "much" better than a week ago. Pt states currently taking Prednisone 30mg . Pt c/o producitve cough with light green to brown mucus  Comments Medications reviewed with patient Verified pt's contact number Zackery Barefoot CMA  March 16, 2009 2:08 PM    Physical Exam  Additional Exam:  Gen. Pleasant, well-nourished, in no distress ENT - no lesions, no post nasal drip Neck: No JVD, no thyromegaly, no carotid bruits Lungs: no use of accessory muscles, no dullness to percussion, decreased BS bialterally, faint BL rhonchi Cardiovascular: Rhythm regular, heart sounds  normal, no murmurs or gallops, no peripheral edema Musculoskeletal: No deformities, no cyanosis or clubbing      Impression & Recommendations:  Problem # 1:  CHRONIC OBSTRUCTIVE PULMONARY DISEASE, ACUTE EXACERBATION (ICD-491.21) Taper prednsione as directed Orders: Est. Patient Level IV (56213)  Problem # 2:  PULMONARY NODULE (ICD-518.89) Reviewed CT - LUL patchy density appears infectious/ inflammatory ? related to recent bronchitis. Will need 3 mnth FU, doubt BAC - was not present on earlier CT Orders: Est. Patient Level  IV (08657) Radiology Referral (Radiology)  Problem # 3:  ANXIETY STATE, UNSPECIFIED (ICD-300.00) ct xanax His updated medication list for this problem includes:    Alprazolam 0.5 Mg Tabs (Alprazolam) .Marland Kitchen... Take one tablet every 12 hours as needed  Medications Added to Medication List This Visit: 1)  Benicar 20 Mg Tabs (Olmesartan medoxomil) .... 1/2 tab once daily  Patient Instructions: 1)  Please schedule a follow-up appointment in 3 months after CT scan 2)  Taper prednisone as described. Prescriptions: BENICAR 20 MG TABS (OLMESARTAN MEDOXOMIL) 1/2 tab once daily  #30 x 3   Entered and Authorized by:   Comer Locket Vassie Loll MD   Signed by:   Comer Locket Vassie Loll MD on 03/16/2009   Method used:   Electronically to        [facility]* (retail)       [address]  [place], [place]  [address]       Ph: [phone]       Fax: [phone]   RxID:   [id number]

## 2010-03-19 NOTE — Assessment & Plan Note (Signed)
Summary: 6 MO FU REMINDER-SRS   Visit Type:  Follow-up Referring Provider:  Dr.Alic Hilburn Primary Provider:  [facility]   History of Present Illness: the patient is a 60 year old male patient of coronary artery disease, status post prior stent placement. The patient also past peripheralvascular disease including abdominal aortic aneurysm, 3.4 cm in diameter and followed annually.the patient has been evaluated for a pulmonary nodule. He does have COPD Gold stage 3. The patient had a PET scan which showed no hypermetabolic activity in his lung lesion. It was felt to be scar tissue. He does have occasional COPD exacerbations. He also occasional panic attacks often associated with increased dyspnea. The patient does report increased irritability. He states he used to work quite a bit but that has improved after start taking Xanax. The patient is apprised of nonsustained ventricular tachycardia with a noninducible EP study in 2004.  Unfortunately continues to smoke. Is down to 2-3 cigarettes a day. He reports no symptoms of heart failure. He has no orthopnea PND palpitations or syncope.  Preventive Screening-Counseling & Management  Alcohol-Tobacco     Smoking Status: current     Smoking Cessation Counseling: yes     Packs/Day: <1/3 PPD  Current Problems (verified): 1)  Aaa  (ICD-441.4) 2)  Peripheral Vascular Disease  (ICD-443.9) 3)  Coronary Atherosclerosis Native Coronary Artery  (ICD-414.01) 4)  Anxiety State, Unspecified  (ICD-300.00) 5)  Chronic Obstructive Pulmonary Disease, Acute Exacerbation  (ICD-491.21) 6)  Tobacco Abuse  (ICD-305.1) 7)  Pulmonary Nodule  (ICD-518.89) 8)  C O P D  (ICD-496) 9)  Hx of Myocardial Infarction  (ICD-410.90) 10)  Hypertension  (ICD-401.9) 11)  Asthma  (ICD-493.90)  Current Medications (verified): 1)  Plavix 75 Mg  Tabs (Clopidogrel Bisulfate) .... Take One Tablet By Mouth Once Daily 2)  Ventolin Hfa 108 (90 Base) Mcg/act  Aers (Albuterol  Sulfate) .Marland Kitchen.. 1-2 Puffs Every 4-6 Hours As Needed 3)  Bayer Aspirin 325 Mg Tabs (Aspirin) .... Take 1 Tablet By Mouth Once A Day 4)  Advair Diskus 250-50 Mcg/dose  Misc (Fluticasone-Salmeterol) .... Inhale 1 Puff Twice Daily. Rinse Mouth After Use. 5)  Albuterol Sulfate (2.5 Mg/23ml) 0.083% Nebu (Albuterol Sulfate) .Marland Kitchen.. 1 Vial Two Times Daily in Nebulizer 6)  Benicar 20 Mg Tabs (Olmesartan Medoxomil) .... 1/2 Once Daily 7)  Bisoprolol Fumarate 5 Mg Tabs (Bisoprolol Fumarate) .... 1/2 Tab By Mouth Once Daily 8)  Oxygen .... 2l As Needed (Ahc) 9)  Alprazolam 0.5 Mg Tabs (Alprazolam) .... Take One Tablet Every 12 Hours As Needed 10)  Spiriva Handihaler 18 Mcg Caps (Tiotropium Bromide Monohydrate) .Marland Kitchen.. 1 Puff Once Daily 11)  Nitroglycerin 0.4 Mg Subl (Nitroglycerin) .... Place 1 Tablet Under Tongue As Directed 12)  Red Yeast Rice 600 Mg Tabs (Red Yeast Rice Extract) .... Take 1 Tablet By Mouth Once A Day 13)  Cetirizine Hcl 10 Mg Tabs (Cetirizine Hcl) .... Take 1 Tablet By Mouth Once A Day  Allergies (verified): No Known Drug Allergies  Comments:  Nurse/Medical Assistant: The patient's medication bottles and allergies were reviewed with the patient and were updated in the Medication and Allergy Lists.  Past History:  Past Medical History: Last updated: 11/20/2008 TOBACCO ABUSE (ICD-305.1) - quit 10/09 PULMONARY NODULE (ICD-518.89) C O P D (ICD-496) Hx of MYOCARDIAL INFARCTION (ICD-410.90) HYPERTENSION (ICD-401.9) ASTHMA (ICD-493.90)  peripheral vascular disease, distal aneurysm circumferential ending immediately above the iliac bifurcation, left iliac stenosis of 50%. Coronary artery disease severe single-vessel coronary arteries with mildly reduced ejection fraction EF 45%  significant for her wall motion abnormality.  Chronically occluded right coronary artery.  1. Severe ischemic cardiomyopathy.     a.     EF 40-45% with inferior apical akinesis by 2-D echo, April      2009.     b.      EF 30-35% by cardiac catheterization in 2004.     c.     Status post NSTEMI/bare-metal stenting of proximal LAD,      October 2004.     d.     Residual, chronic 100% distal RCA stenosis.     e.     Status post MI/PCI in 1989; HSRA/stenting in 1997 [facility]). 2. History of nonsustained ventricular tachycardia.     a.     Noninducible electrophysiology study in 2004. 3. Severe COPD/bullous emphysema.     a.     Longstanding tobacco smoking. 4. Pulmonary nodules.     a.     Confirmed by recent follow up chest CT scan. 5. Peripheral vascular disease.     a.     LLE claudication with 0.90 ABI; 0.94 on the right. 6. History of orthostatic hypotension. 7. Dyslipidemia.   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  Patient states former smoker.   Risk Factors: Smoking Status: current (07/31/2009) Packs/Day: <1/3 PPD (07/31/2009)  Social History: Smoking Status:  current Packs/Day:  <1/3 PPD  Vital Signs:  Patient profile:   60 year old male Height:      64 inches Weight:      117 pounds Pulse rate:   72 / minute BP sitting:   121 / 79  (left arm) Cuff size:   regular  Vitals Entered By: Carlye Grippe (July 31, 2009 9:39 AM)  Physical Exam  Additional Exam:  General: Well-developed, well-nourished in no distress head: Normocephalic and atraumatic eyes PERRLA/EOMI intact, conjunctiva and lids normal nose: No deformity or lesions mouth normal dentition, normal posterior pharynx neck: Supple, no JVD.  No masses, thyromegaly or abnormal cervical nodes lungs: decreased breath sounds sounds bilaterally without wheezing.  Normal percussion heart: regular rate and rhythm with normal S1 and S2, no S3 or S4.  PMI is  normal.  No pathological murmurs abdomen: Normal bowel sounds, abdomen is soft and nontender without masses, organomegaly or hernias noted.  No hepatosplenomegaly musculoskeletal: Back normal, normal gait muscle strength and tone normal pulsus: Pulse is normal in all 4 extremities Extremities: No peripheral pitting edema neurologic: Alert and oriented x 3 skin: Intact without lesions or rashes cervical nodes: No significant adenopathy psychologic: Normal affect    Impression & Recommendations:  Problem # 1:  AAA (ICD-441.4) followup abdominal ultrasound in one year. Aneurysm is 3.4 cm and small  Problem # 2:  CORONARY ATHEROSCLEROSIS NATIVE CORONARY ARTERY (ICD-414.01) the patient has significant coronary artery disease but no recurrent chest pain. No indication for stress testing. The patient remains on aspirin and Plavix. His updated medication list for this problem includes:    Plavix 75 Mg Tabs (Clopidogrel bisulfate) .Marland Kitchen... Take one tablet by mouth once daily    Bayer Aspirin 325 Mg Tabs (Aspirin) .Marland Kitchen... Take 1 tablet by mouth once a day    Bisoprolol Fumarate 5 Mg Tabs (Bisoprolol fumarate) .Marland Kitchen... 1/2 tab by mouth once daily  Nitrostat 0.4 Mg Subl (Nitroglycerin) .Marland Kitchen... 1 tablet under tongue at onset of chest pain; you may repeat every 5 minutes for up to 3 doses.  Problem # 3:  ANXIETY STATE, UNSPECIFIED (ICD-300.00) I have added long-term treatment with an SSRI. Started citalopram 10 mg p.o. q. daily and clonazepam 0.25 mg p.o. b.i.d. I told the patient to minimize the use of alprazolam. I also recommended that he discontinue clonazepam in approximately 4-6 weeks, thereafter he can still take p.r.n. Xanax if so needed  Problem # 4:  PULMONARY NODULE (ICD-518.89) followed by pulmonary and felt to be nonmalignant  Problem # 5:  TOBACCO ABUSE (ICD-305.1) I counseled the patient regarding his tobacco use.  Patient Instructions: 1)  Your physician wants you to follow-up in: 6  months. You will receive a reminder letter in the mail one-two months in advance. If you don't receive a letter, please call our office to schedule the follow-up appointment. 2)  Your physician has requested that you have an abdominal aorta duplex. During this test, an ultrasound is used to evaluate the aorta. Allow 30 minutes for this exam. Do not eat after midnight the day before and avoid carbonated beverages. There are no restrictions or special instructions. DO THIS STUDY IN 1 YEAR. 3)  Take clonazepam 0.5mg  1/2 tablet two times a day for no more than 6 weeks. 4)  Take Citalopram 10mg  by mouth once daily for 4 weeks. Then increase to 20mg  once daily. A prescription for the 20mg  tablets has been sent to your pharmacy.  Prescriptions: CLONAZEPAM 0.5 MG TABS (CLONAZEPAM) Take 1/2 tabet by mouth two times a day  #30 x 0   Entered by:   Cyril Loosen, RN, BSN   Authorized by:   Lewayne Bunting, MD, [facility]   Signed by:   Lewayne Bunting, MD, [facility] on 07/31/2009   Method used:   Handwritten   RxID:   [id number] CITALOPRAM HYDROBROMIDE 20 MG TABS (CITALOPRAM HYDROBROMIDE) Take 1 tablet by mouth once a day after taking 10mg  tablets for 4 weeks.  #30 x 1   Entered by:   Cyril Loosen, RN, BSN   Authorized by:   Lewayne Bunting, MD, [facility]   Signed by:   Cyril Loosen, RN, BSN on 07/31/2009   Method used:   Electronically to        [facility]* (retail)       [address]       [place], [place]  [address]       Ph: [phone]       Fax: [phone]   RxID:   [id number] CITALOPRAM HYDROBROMIDE 10 MG TABS (CITALOPRAM HYDROBROMIDE) Take 1 tablet by mouth once a day for 4 weeks. Then increase to 20mg  by mouth once daily.  #30 x 0   Entered by:   Cyril Loosen, RN, BSN   Authorized by:   Lewayne Bunting, MD, [facility]   Signed by:   Cyril Loosen, RN, BSN on 07/31/2009   Method used:   Electronically to        [facility]* (retail)       [address]       [place],  [place]  [address]       Ph: [phone]       Fax: [phone]   RxID:   [id number] CLONAZEPAM 0.5 MG TABS (CLONAZEPAM) Take 1/2 tabet by mouth two times a day  #  60 x 0   Entered by:   Cyril Loosen, RN, BSN   Authorized by:   Lewayne Bunting, MD, [facility]   Signed by:   Cyril Loosen, RN, BSN on 07/31/2009   Method used:   Handwritten   RxID:   [id number] NITROSTAT 0.4 MG SUBL (NITROGLYCERIN) 1 tablet under tongue at onset of chest pain; you may repeat every 5 minutes for up to 3 doses.  #25 x 3   Entered by:   Cyril Loosen, RN, BSN   Authorized by:   Lewayne Bunting, MD, [facility]   Signed by:   Cyril Loosen, RN, BSN on 07/31/2009   Method used:   Electronically to        [facility]* (retail)       [address]       [place], [place]  [address]       Ph: [phone]       Fax: [phone]   RxID:   [id number]

## 2010-03-19 NOTE — Letter (Signed)
Summary: [facility]  [facility]   Imported By: Lester [place] 02/26/2009 10:54:43  _____________________________________________________________________  External Attachment:    Type:   Image     Comment:   External Document

## 2010-03-19 NOTE — Miscellaneous (Signed)
Summary: [facility]   Imported By: Lester [place] 04/02/2009 07:45:44  _____________________________________________________________________  External Attachment:    Type:   Image     Comment:   External Document

## 2010-03-19 NOTE — Letter (Signed)
Summary: [facility]  [facility]   Imported By: Lester [place] 03/21/2009 10:01:07  _____________________________________________________________________  External Attachment:    Type:   Image     Comment:   External Document

## 2010-03-19 NOTE — Progress Notes (Signed)
Summary: rx req/ cough  Phone Note Call from Patient Call back at [phone]   Caller: Patient Call For: Aaron [name] Summary of Call: pt coughing up green phlegm today. requests abx. [facility]. (pt was seen yesterday and given prednisone). call pt at [phone] Initial call taken by: Tivis Ringer,  February 23, 2009 2:08 PM  Follow-up for Phone Call        pt states phlegm is now green and is requesting an abx, please advise. Carron Curie CMA  February 23, 2009 2:49 PM   Additional Follow-up for Phone Call Additional follow up Details #1::        avelox 400mg  once daily #7 no refills Additional Follow-up by: Rubye Oaks NP,  February 23, 2009 3:23 PM    Additional Follow-up for Phone Call Additional follow up Details #2::    rx sent to pharmacy.  pt is aware. Boone Master CNA  February 23, 2009 4:14 PM   New/Updated Medications: AVELOX 400 MG TABS (MOXIFLOXACIN HCL) Take 1 tablet by mouth once a day x7 days Prescriptions: AVELOX 400 MG TABS (MOXIFLOXACIN HCL) Take 1 tablet by mouth once a day x7 days  #7 x 0   Entered by:   Boone Master CNA   Authorized by:   Rubye Oaks NP   Signed by:   Boone Master CNA on 02/23/2009   Method used:   Electronically to        [facility]* (retail)       [address]       [place], [place]  [address]       Ph: [phone]       Fax: [phone]   RxID:   [id number]

## 2010-03-19 NOTE — Progress Notes (Signed)
Summary: Needs ov or go to ER  Phone Note Call from Patient   Caller: Patient Call For: Aaron [name] Summary of Call: pt c/o SOB/ cough "mostly clear but when coughing deep it is brownish in color. this x 1 week. denies fever. no N or V. pt has not taken any OTC meds. no transportation avail to come in for OV. he requests rx of prednisone and abx. [facility]. pt # [id number] Initial call taken by: Tivis Ringer, CNA,  September 25, 2009 8:57 AM  Follow-up for Phone Call        [facility].  Aundra Millet Reynolds LPN  September 25, 2009 9:20 AM   pt returned my call.  pt c/o increased sob w/ exertion.  pt c/o PND, coughing up thick "chunky" brown sputum but states this is hard to cough up.  Also c/o wheezing, "rattling in chest, tighness in chest, and feeling tired.  Pt denied fever.  Pt has increased his albuterol neb use from two times a day to three times a day.  And has increased use of rescue hfa to 4 to 5 times daily. Pt states he has also had to use o2 qhs d/t sob. Pt states he has tried mucinex but states this "doesn't help."  Offered appt today.  Pt declined stating he has no transportation.  Symptoms started 1 week ago.  RA out of office this week.  Will forward message to MW  to address.  Aundra Millet Reynolds LPN  September 25, 2009 9:34 AM  NK[facility] can't treat this over the phone, needs ov or call for ER transport  Follow-up by: Nyoka Cowden MD,  September 25, 2009 9:39 AM  Additional Follow-up for Phone Call Additional follow up Details #1::        called and spoke with pt.  pt scheduled appt with TP for this afternoon at 2:45 [facility] LPN  September 25, 2009 9:43 AM

## 2010-03-19 NOTE — Progress Notes (Signed)
Summary: Alprazolam refill request  Phone Note Refill Request Message from:  Fax from Pharmacy  Refills Requested: Medication #1:  ALPRAZOLAM 0.5 MG TABS Take one tablet every 12 hours as needed   Last Refilled: 05/18/2009   Notes: #[facility] [address] phone [phone] Pt has appt with RA 06-21-2009 fax [phone]  Initial call taken by: Zackery Barefoot CMA,  Jun 19, 2009 4:26 PM  Follow-up for Phone Call        ok  With how many refills? Zackery Barefoot CMA  Jun 19, 2009 4:42 PM    x1 Follow-up by: Comer Locket. Vassie Loll MD,  Jun 20, 2009 8:10 AM  Additional Follow-up for Phone Call Additional follow up Details #1::        Rx called to pharmacy Additional Follow-up by: Zackery Barefoot CMA,  Jun 20, 2009 10:38 AM    Prescriptions: ALPRAZOLAM 0.5 MG TABS (ALPRAZOLAM) Take one tablet every 12 hours as needed  #60 x 1   Entered by:   Zackery Barefoot CMA   Authorized by:   Comer Locket. Vassie Loll MD   Signed by:   Zackery Barefoot CMA on 06/20/2009   Method used:   Telephoned to ...       [facility]* (retail)       [address]       [place], [place]  [address]       Ph: [phone]       Fax: [phone]   RxID:   [id number]

## 2010-03-19 NOTE — Assessment & Plan Note (Signed)
Summary: 2 weeks/apc   Visit Type:  Follow-up Copy to:  Dr.Degent Primary Provider/Referring Provider:  [facility]  CC:  Pt here for follow up. Pt c/o productive cough with clear mucus, chest tightness when stressed, and SOB "have good days and bad days".  History of Present Illness: 58/F, ex- smoker for FU of pulm nodules & COPD Gold stg 3 -FEV1 31% 1/11  CT chest>> stable rt nodules, new LUL 3.1 cm patchy density, 8mm new lUL subpleural nodule 4/11 1.6 cm LUL spiculation, PET SUV 2.0 On xanax for anxiety. 2 acute visits in dec 2010.  March 16, 2009 2:25 PM  TP changed lisinopril to benicar. Went to ER in [place] because of "panic attacks."  states he is still having thick mucus that is really hard to get up. Given steroid course.  Jul 06, 2009 4:44 PM  -with daughter to discuss results of PET  Stable on same dose of xanax, off prednisone, c/o runny nose. Breathing close to baseline. Denies chest pain, dyspnea, orthopnea, hemoptysis, fever, n/v/d, edema, headache.   Preventive Screening-Counseling & Management  Alcohol-Tobacco     Smoking Status: quit     Packs/Day: 1.0     Year Started: 1962     Year Quit: 2010  Current Medications (verified): 1)  Plavix 75 Mg  Tabs (Clopidogrel Bisulfate) .... Take One Tablet By Mouth Once Daily 2)  Ventolin Hfa 108 (90 Base) Mcg/act  Aers (Albuterol Sulfate) .Marland Kitchen.. 1-2 Puffs Every 4-6 Hours As Needed 3)  Bayer Aspirin 325 Mg Tabs (Aspirin) .... Take 1 Tablet By Mouth Once A Day 4)  Advair Diskus 250-50 Mcg/dose  Misc (Fluticasone-Salmeterol) .... Inhale 1 Puff Twice Daily. Rinse Mouth After Use. 5)  Albuterol Sulfate (2.5 Mg/21ml) 0.083% Nebu (Albuterol Sulfate) .Marland Kitchen.. 1 Vial Two Times Daily in Nebulizer 6)  Benicar 20 Mg Tabs (Olmesartan Medoxomil) .... 1/2 Once Daily 7)  Bisoprolol Fumarate 5 Mg Tabs (Bisoprolol Fumarate) .... 1/2 Tab By Mouth Once Daily 8)  Oxygen .... 2l As Needed (Ahc) 9)  Alprazolam 0.5 Mg Tabs (Alprazolam) ....  Take One Tablet Every 12 Hours As Needed 10)  Spiriva Handihaler 18 Mcg Caps (Tiotropium Bromide Monohydrate) .Marland Kitchen.. 1 Puff Once Daily 11)  Nitroglycerin 0.4 Mg Subl (Nitroglycerin) .... Place 1 Tablet Under Tongue As Directed 12)  Red Yeast Rice 600 Mg Tabs (Red Yeast Rice Extract) .... Take 1 Tablet By Mouth Once A Day 13)  Zyrtec Allergy 10 Mg Caps (Cetirizine Hcl) .... Once Daily  Allergies (verified): No Known Drug Allergies  Past History:  Past Medical History: Last updated: 11/20/2008 TOBACCO ABUSE (ICD-305.1) - quit 10/09 PULMONARY NODULE (ICD-518.89) C O P D (ICD-496) Hx of MYOCARDIAL INFARCTION (ICD-410.90) HYPERTENSION (ICD-401.9) ASTHMA (ICD-493.90)  peripheral vascular disease, distal aneurysm circumferential ending immediately above the iliac bifurcation, left iliac stenosis of 50%. Coronary artery disease severe single-vessel coronary arteries with mildly reduced ejection fraction EF 45% significant for her wall motion abnormality.  Chronically occluded right coronary artery.  1. Severe ischemic cardiomyopathy.     a.     EF 40-45% with inferior apical akinesis by 2-D echo, April      2009.     b.     EF 30-35% by cardiac catheterization in 2004.     c.     Status post NSTEMI/bare-metal stenting of proximal LAD,      October 2004.     d.     Residual, chronic 100% distal RCA stenosis.     e.  Status post MI/PCI in 1989; HSRA/stenting in 1997 [facility] 2. History of nonsustained ventricular tachycardia.     a.     Noninducible electrophysiology study in 2004. 3. Severe COPD/bullous emphysema.     a.     Longstanding tobacco smoking. 4. Pulmonary nodules.     a.     Confirmed by recent follow up chest CT scan. 5. Peripheral vascular disease.     a.     LLE claudication with 0.90 ABI; 0.94 on the right. 6. History of orthostatic hypotension. 7. Dyslipidemia.   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  Patient states former smoker.   Past Pulmonary History:  Pulmonary History: 1. CXR at [facility] showed RUL nodule >> CT 6/09 showed multiple nodules as descibed  -of concern 7mm RUL with sub pleural scarring & 8mm LUL. >> stable on CT 3/10 but new nodules Lt lung 7/10 >> Stable right upper lobe and left upper lobe irregular nodular densities.  Previously noted left lower lobe density resolved.   1/11 >> stable rt nodules, new LUL 3.1 cm patchy density, 8mm new lUL subpleural nodule  2. 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  Patient states former smoker.   Review of Systems       The patient complains of dyspnea on exertion.  The patient denies anorexia, fever, weight loss, weight gain, vision loss, decreased hearing, hoarseness, chest pain, syncope, peripheral edema, prolonged cough, headaches, hemoptysis, abdominal pain, melena, hematochezia, severe indigestion/heartburn, hematuria, muscle weakness, suspicious skin lesions, difficulty walking, depression, unusual weight change, and abnormal bleeding.    Vital Signs:  Patient profile:   60 year old male Height:      64 inches Weight:      120.31 pounds O2 Sat:      97 % on Room air Temp:     98.6 degrees F oral Pulse rate:   75 / minute BP sitting:   110 / 74  (left arm) Cuff size:   regular  Vitals Entered By: Zackery Barefoot CMA (Jul 06, 2009 4:34 PM)  O2 Flow:  Room air CC: Pt here for follow up. Pt c/o productive cough with clear mucus, chest tightness when stressed, SOB "have good days and bad days" Comments Medications reviewed with patient Verified contact number and pharmacy with patient Zackery Barefoot  CMA  Jul 06, 2009 4:35 PM    Physical Exam  Additional Exam:  Gen. Pleasant, well-nourished, in no distress ENT - no lesions, no post nasal drip Neck: No JVD, no thyromegaly, no carotid bruits Lungs: no use of accessory muscles, no dullness to percussion, decreased BS bialterally, faint BL rhonchi Cardiovascular: Rhythm regular, heart sounds  normal, no murmurs or gallops, no peripheral edema Musculoskeletal: No deformities, no cyanosis or clubbing      Imaging Exam  Procedure date:  06/28/2009  Findings:      PET SCAN IMPRESSION:   1.  No abnormal hypermetabolic activity within the spiculated left upper lobe lung nodule, suggesting that this represents benign scarring.  One caveat is that bronchoalveolar carcinoma may have low-level metabolic activity, but the CT appearance of this lesion does not suggest BAC. 2.  No evidence of malignancy within the neck, chest, abdomen or pelvis. 3.  Approximate 3.4 cm infrarenal abdominal aortic aneurysm, slightly increased in size since a June, 2009 ultrasound where it measured 3.1 cm. 4.  Marked prostate gland enlargement.    Impression & Recommendations:  Problem # 1:  PULMONARY NODULE (ICD-518.89) LUL -Favor scarring based on PET but will advise CT in - follow until jan 2013 or resolution Orders: Est. Patient Level III ([address]) Radiology Referral (Radiology)  Problem # 2:  C O P D (ICD-496) stable on current regimen  Problem # 3:  AAA (ICD-441.4) -followed by dr degent- stable since 10/10 but growth since 2009  Medications Added to Medication List This Visit: 1)  Oxygen  .... 2l as needed (ahc)  Patient Instructions: 1)  Copy sent to: Dr degent 2)  Please schedule a follow-up appointment in 4 months with TP 3)  A Chest CT WITHOUT Contrast has been recommended. Your imaging study may require preauthorization.

## 2010-03-19 NOTE — Progress Notes (Signed)
Summary: breathing problem  Phone Note [name] from Patient   Caller: Son Aaron [name] For: [name] Summary of [name]: breathing not getting any better  having panic attack [place] [facility] Initial [name] taken by: Rickard Patience,  March 02, 2009 1:42 PM  Follow-up for Phone [name]        called, spoke with pt.  Pt states he had to go to ER in [place] on Wednesday because of "panic attacks."  states he is still having thick mucus that is really hard to get up and because of this he starts to have panic attacks.  Was given Levaquin 500mg  and prednisone was increased to 20mg  in ER.  Pt states he is still taking mucinex.  requesting RA to increase xanax "because I've been on this dose for a long time and don't thinks it's working as well,"  and requesting another neb to help break mucus up.  Pt offered ov but refused stating he couldn't make it to [place].  WIll forwad to RA-please advise.  Thanks! Follow-up by: Gweneth Dimitri RN,  March 02, 2009 2:31 PM  Additional Follow-up for Phone [name] Additional follow up Details #1::        1. Inc rease prednisone to 40 mg x 1 wk, then 30 mg x 1 week, 20 mg x 1 week, 10 mg x 1 week, then off. 2. use mucinex for cough 3. No increase in xanax 4. OV when able to come 5. Albuterol nebs 4-5 times /d as needed   Additional Follow-up by: Comer Locket. Vassie Loll MD,  March 02, 2009 3:51 PM    Additional Follow-up for Phone [name] Additional follow up Details #2::    rx for prednisone and albuterol neb sent. pt aware of recs andschedule dfor appt on 03/16/09 with RA. Carron Curie CMA  March 02, 2009 4:10 PM   New/Updated Medications: ALBUTEROL SULFATE 0.63 MG/3ML  NEBU (ALBUTEROL SULFATE) use 4-5 times daily as needed PREDNISONE 10 MG TABS (PREDNISONE) take 4 tabs daily x 7 days, then 3 tabs daily x 7 days, 2 tabs daily x7 days, one tab daily x 7 days, then off. Prescriptions: ALBUTEROL SULFATE 0.63 MG/3ML  NEBU (ALBUTEROL SULFATE) use 4-5 times daily as needed  #120 x  0   Entered by:   Carron Curie CMA   Authorized by:   Comer Locket. Vassie Loll MD   Signed by:   Carron Curie CMA on 03/02/2009   Method used:   Electronically to        [facility]* (retail)       [address]       [place], [place]  [address]       Ph: [phone]       Fax: [phone]   RxID:   [id number] PREDNISONE 10 MG TABS (PREDNISONE) take 4 tabs daily x 7 days, then 3 tabs daily x 7 days, 2 tabs daily x7 days, one tab daily x 7 days, then off.  #70 x 0   Entered by:   Carron Curie CMA   Authorized by:   Comer Locket. Vassie Loll MD   Signed by:   Carron Curie CMA on 03/02/2009   Method used:   Electronically to        [facility]* (retail)       [address]       [place], [place]  [address]  Ph: [phone]       Fax: [phone]   RxID:   [id number]

## 2010-03-19 NOTE — Assessment & Plan Note (Signed)
Summary: Acute NP office visit - COPD   Copy to:  Dr.Degent Primary Provider/Referring Provider:  [facility]  CC:  increased SOB, prod cough with brown to clear mucus, and wheezing x1week - denies f/c/s.  History of Present Illness: 60/F, ex- smoker for FU of pulm nodules & COPD Gold stg 3 -FEV1 31% 1/11  CT chest>> stable rt nodules, new LUL 3.1 cm patchy density, 8mm new lUL subpleural nodule 4/11 1.6 cm LUL spiculation, PET SUV 2.0 On xanax for anxiety. 2 acute visits in dec 2010.  March 16, 2009 2:25 PM  TP changed lisinopril to benicar. Went to ER in [place] because of "panic attacks."  states he is still having thick mucus that is really hard to get up. Given steroid course.  Jul 06, 2009 4:44 PM  -with daughter to discuss results of PET  Stable on same dose of xanax, off prednisone, c/o runny nose. Breathing close to baseline.   September 25, 2009--Presents for an acute office visit. Complains of increased SOB, prod cough with brown to clear mucus, wheezing x1week . cough is getting worse. Now getting up thick yellow and brown mucus. Denies chest pain,  orthopnea, hemoptysis, fever, n/v/d, edema, headache.   Preventive Screening-Counseling & Management  Alcohol-Tobacco     Smoking Status: quit  Medications Prior to Update: 1)  Plavix 75 Mg  Tabs (Clopidogrel Bisulfate) .... Take One Tablet By Mouth Once Daily 2)  Ventolin Hfa 108 (90 Base) Mcg/act  Aers (Albuterol Sulfate) .Marland Kitchen.. 1-2 Puffs Every 4-6 Hours As Needed 3)  Bayer Aspirin 325 Mg Tabs (Aspirin) .... Take 1 Tablet By Mouth Once A Day 4)  Advair Diskus 250-50 Mcg/dose  Misc (Fluticasone-Salmeterol) .... Inhale 1 Puff Twice Daily. Rinse Mouth After Use. 5)  Albuterol Sulfate (2.5 Mg/27ml) 0.083% Nebu (Albuterol Sulfate) .Marland Kitchen.. 1 Vial Two Times Daily in Nebulizer 6)  Benicar 20 Mg Tabs (Olmesartan Medoxomil) .... 1/2 Once Daily 7)  Bisoprolol Fumarate 5 Mg Tabs (Bisoprolol Fumarate) .... 1/2 Tab By Mouth Once  Daily 8)  Oxygen .... 2l As Needed (Ahc) 9)  Alprazolam 0.5 Mg Tabs (Alprazolam) .... Take One Tablet Every 12 Hours As Needed 10)  Spiriva Handihaler 18 Mcg Caps (Tiotropium Bromide Monohydrate) .Marland Kitchen.. 1 Puff Once Daily 11)  Nitrostat 0.4 Mg Subl (Nitroglycerin) .Marland Kitchen.. 1 Tablet Under Tongue At Onset of Chest Pain; You May Repeat Every 5 Minutes For Up To 3 Doses. 12)  Red Yeast Rice 600 Mg Tabs (Red Yeast Rice Extract) .... Take 1 Tablet By Mouth Once A Day 13)  Cetirizine Hcl 10 Mg Tabs (Cetirizine Hcl) .... Take 1 Tablet By Mouth Once A Day 14)  Clonazepam 0.5 Mg Tabs (Clonazepam) .... Take 1/2 Tabet By Mouth Two Times A Day 15)  Citalopram Hydrobromide 10 Mg Tabs (Citalopram Hydrobromide) .... Take 1 Tablet By Mouth Once A Day For 4 Weeks. Then Increase To 20mg  By Mouth Once Daily. 16)  Citalopram Hydrobromide 20 Mg Tabs (Citalopram Hydrobromide) .... Take 1 Tablet By Mouth Once A Day After Taking 10mg  Tablets For 4 Weeks.  Current Medications (verified): 1)  Plavix 75 Mg  Tabs (Clopidogrel Bisulfate) .... Take One Tablet By Mouth Once Daily 2)  Ventolin Hfa 108 (90 Base) Mcg/act  Aers (Albuterol Sulfate) .Marland Kitchen.. 1-2 Puffs Every 4-6 Hours As Needed 3)  Bayer Aspirin 325 Mg Tabs (Aspirin) .... Take 1 Tablet By Mouth Once A Day 4)  Advair Diskus 250-50 Mcg/dose  Misc (Fluticasone-Salmeterol) .... Inhale 1 Puff Twice Daily. Rinse  Mouth After Use. 5)  Albuterol Sulfate (2.5 Mg/55ml) 0.083% Nebu (Albuterol Sulfate) .Marland Kitchen.. 1 Vial Two Times Daily in Nebulizer 6)  Benicar 20 Mg Tabs (Olmesartan Medoxomil) .... 1/2 Once Daily 7)  Bisoprolol Fumarate 5 Mg Tabs (Bisoprolol Fumarate) .... 1/2 Tab By Mouth Once Daily 8)  Oxygen .... 2l As Needed (Ahc) 9)  Alprazolam 0.5 Mg Tabs (Alprazolam) .... Take One Tablet Every 12 Hours As Needed 10)  Spiriva Handihaler 18 Mcg Caps (Tiotropium Bromide Monohydrate) .Marland Kitchen.. 1 Puff Once Daily 11)  Nitrostat 0.4 Mg Subl (Nitroglycerin) .Marland Kitchen.. 1 Tablet Under Tongue At Onset of Chest  Pain; You May Repeat Every 5 Minutes For Up To 3 Doses. 12)  Red Yeast Rice 600 Mg Tabs (Red Yeast Rice Extract) .... Take 1 Tablet By Mouth Once A Day 13)  Cetirizine Hcl 10 Mg Tabs (Cetirizine Hcl) .... 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 Coronary artery disease severe single-vessel coronary arteries with mildly reduced ejection fraction EF 45% significant for her wall motion abnormality.  Chronically occluded right coronary artery.  1. Severe ischemic cardiomyopathy.     a.     EF 40-45% with inferior apical akinesis by 2-D echo, April      2009.     b.     EF 30-35% by cardiac catheterization in 2004.     c.     Status post NSTEMI/bare-metal stenting of proximal LAD,      October 2004.     d.     Residual, chronic 100% distal RCA stenosis.     e.     Status post MI/PCI in 1989; HSRA/stenting in 1997 [facility]). 2. History of nonsustained ventricular tachycardia.     a.     Noninducible electrophysiology study in 2004. 3. Severe COPD/bullous emphysema.     a.     Longstanding tobacco smoking. 4. Pulmonary nodules.     a.     Confirmed by recent follow up chest CT scan. 5. Peripheral vascular disease.     a.     LLE claudication with 0.90 ABI; 0.94 on the right. 6. History of orthostatic hypotension. 7. Dyslipidemia.   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  Patient states former smoker.   Risk Factors: Smoking Status: quit (09/25/2009) Packs/Day: <1/3 PPD (07/31/2009)  Past Pulmonary History:  Pulmonary History: 1. CXR at [facility] showed RUL nodule >> CT 6/09 showed multiple nodules as descibed  -of concern 7mm RUL with sub pleural scarring & 8mm LUL. >> stable on CT 3/10 but new nodules Lt lung 7/10 >> Stable right upper lobe and left upper lobe irregular nodular densities.  Previously noted left lower lobe density resolved.   1/11 >> stable rt nodules, new LUL 3.1 cm patchy density, 8mm new lUL subpleural nodule  2. severe COPD FEV1 31% 6/09, DLCO 49%, high TLC c/w emphysema 10/09 COPD exacerbation >> hospitalised  - improved  remarkably with steroids again 11/09 has quit smoking since d/c Vaccines uptodate  Social History: Smoking Status:  quit  Review of Systems      See HPI  Vital Signs:  Patient profile:   60 year old male Height:      64 inches Weight:      119.38 pounds BMI:     20.57 O2 Sat:      95 % on Room air Temp:     98.7 degrees F oral Pulse rate:   80 / minute BP sitting:   126 / 72  (left arm) Cuff size:   regular  Vitals Entered By: Boone Master CNA/MA (September 25, 2009 2:51 PM)  O2 Flow:  Room air  Physical Exam  Additional Exam:  Gen. Pleasant, well-nourished, in no distress ENT - no lesions, no post nasal drip Neck: No JVD, no thyromegaly, no carotid bruits Lungs: coarse BS w/ scattered rhonchi Cardiovascular: Rhythm regular, heart sounds  normal, no murmurs or gallops, no peripheral edema Musculoskeletal: No deformities, no cyanosis or clubbing      Impression & Recommendations:  Problem # 1:  CHRONIC OBSTRUCTIVE PULMONARY DISEASE, ACUTE EXACERBATION (ICD-491.21)  Flare REC:  Augmentin 875mg  two times a day w/ food for 7 days  Mcuinex DM two times a day as needed  coughcongestion Prednisone taper over next week  Please contact office for sooner follow up if symptoms do not improve or worsen  follow up 1 month as scheduled.   Orders: Est. Patient Level IV ([address])  Medications Added to Medication List This Visit: 1)  Cetirizine Hcl 10 Mg Tabs (Cetirizine hcl) .... Take 1 tablet by mouth once a day as needed allergies 2)  Augmentin 875-125 Mg Tabs (Amoxicillin-pot clavulanate) .Marland Kitchen.. 1 by mouth two times a day 3)  Prednisone 10 Mg Tabs (Prednisone) .... 4 tabs for 2 days, then 3 tabs for 2 days, 2 tabs for 2 days, then 1 tab for 2 days, then stop  Patient Instructions: 1)  Augmentin 875mg  two times a day w/ food for 7 days  2)  Mcuinex DM two times a day as needed coughcongestion 3)  Prednisone taper over next week  4)  Please contact office for sooner follow up if symptoms do not improve or worsen  5)  follow up 1 month as scheduled.  Prescriptions: PREDNISONE 10 MG TABS (PREDNISONE) 4 tabs for 2 days, then 3 tabs for 2 days, 2 tabs for 2 days, then 1 tab for 2 days, then stop  #20 x 0   Entered and Authorized by:   Rubye Oaks NP   Signed by:   Rubye Oaks NP on 09/25/2009   Method used:   Electronically to        [facility]* (retail)       [address]       [place], [place]  [address]       Ph: [phone]       Fax: [phone]   RxID:   [id number] AUGMENTIN 875-125 MG TABS (AMOXICILLIN-POT CLAVULANATE) 1 by mouth two times a day  #14 x 0   Entered and Authorized by:   Rubye Oaks NP   Signed by:   Vinh Sachs NP on 09/25/2009   Method used:   Electronically to        [facility]* (retail)       [address]       [place]  [place], [place]  [address]       Ph: [phone]       Fax: [phone]   RxID:   [id number]

## 2010-03-19 NOTE — Assessment & Plan Note (Signed)
Summary: ROV/ MBW   Visit Type:  Follow-up Copy to:  Dr.Degent Primary Provider/Referring Provider:  [facility]  CC:  Pt here for follow up. Pt c/o increased SOB, rattles, tightness in chest, productive cough with clear mucus, and and wheezing.  History of Present Illness: 60/F, ex- smoker for FU of pulm nodules & COPD Gold stg 3 ,  after recent exacerbation 1/11  CT chest>> stable rt nodules, new LUL 3.1 cm patchy density, 8mm new lUL subpleural nodule 4/11 1.6 cm LUL spiculation On xanax for anxiety. 2 acute visits in dec 2010.  March 16, 2009 2:25 PM  TP changed lisinopril to benicar. Went to ER in [place] because of "panic attacks."  states he is still having thick mucus that is really hard to get up. Given steroid course.  Jun 21, 2009 10:53 AM  Stable on same dose of xanax, off prednisone, c/o runny nose. Breathing close to baseline. Denies chest pain, dyspnea, orthopnea, hemoptysis, fever, n/v/d, edema, headache.   Current Medications (verified): 1)  Plavix 75 Mg  Tabs (Clopidogrel Bisulfate) .... Take One Tablet By Mouth Once Daily 2)  Ventolin Hfa 108 (90 Base) Mcg/act  Aers (Albuterol Sulfate) .Marland Kitchen.. 1-2 Puffs Every 4-6 Hours As Needed 3)  Bayer Aspirin 325 Mg Tabs (Aspirin) .... Take 1 Tablet By Mouth Once A Day 4)  Advair Diskus 250-50 Mcg/dose  Misc (Fluticasone-Salmeterol) .... Inhale 1 Puff Twice Daily. Rinse Mouth After Use. 5)  Albuterol Sulfate (2.5 Mg/30ml) 0.083% Nebu (Albuterol Sulfate) .Marland Kitchen.. 1 Vial Two Times Daily in Nebulizer 6)  Benicar 20 Mg Tabs (Olmesartan Medoxomil) .... 1/2 Once Daily 7)  Bisoprolol Fumarate 5 Mg Tabs (Bisoprolol Fumarate) .... 1/2 Tab By Mouth Once Daily 8)  Oxygen .... 2l As Needed 9)  Alprazolam 0.5 Mg Tabs (Alprazolam) .... Take One Tablet Every 12 Hours As Needed 10)  Spiriva Handihaler 18 Mcg Caps (Tiotropium Bromide Monohydrate) .Marland Kitchen.. 1 Puff Once Daily 11)  Nitroglycerin 0.4 Mg Subl (Nitroglycerin) .... Place 1 Tablet Under  Tongue As Directed 12)  Red Yeast Rice 600 Mg Tabs (Red Yeast Rice Extract) .... Take 1 Tablet By Mouth Once A Day  Allergies (verified): No Known Drug Allergies  Past History:  Past Medical History: Last updated: 11/20/2008 TOBACCO ABUSE (ICD-305.1) - quit 10/09 PULMONARY NODULE (ICD-518.89) C O P D (ICD-496) Hx of MYOCARDIAL INFARCTION (ICD-410.90) HYPERTENSION (ICD-401.9) ASTHMA (ICD-493.90)  peripheral vascular disease, distal aneurysm circumferential ending immediately above the iliac bifurcation, left iliac stenosis of 50%. Coronary artery disease severe single-vessel coronary arteries with mildly reduced ejection fraction EF 45% significant for her wall motion abnormality.  Chronically occluded right coronary artery.  1. Severe ischemic cardiomyopathy.     a.     EF 40-45% with inferior apical akinesis by 2-D echo, April      2009.     b.     EF 30-35% by cardiac catheterization in 2004.     c.     Status post NSTEMI/bare-metal stenting of proximal LAD,      October 2004.     d.     Residual, chronic 100% distal RCA stenosis.     e.     Status post MI/PCI in 1989; HSRA/stenting in 1997 [facility]). 2. History of nonsustained ventricular tachycardia.     a.     Noninducible electrophysiology study in 2004. 3. Severe COPD/bullous emphysema.     a.     Longstanding tobacco smoking. 4. Pulmonary nodules.     a.  Confirmed by recent follow up chest CT scan. 5. Peripheral vascular disease.     a.     LLE claudication with 0.90 ABI; 0.94 on the right. 6. History of orthostatic hypotension. 7. Dyslipidemia.   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  Patient admits to prior drug use.   Past Pulmonary History:  Pulmonary History: 1. CXR at [facility] showed RUL nodule >> CT 6/09 showed multiple nodules as descibed  -of concern 7mm RUL with sub pleural scarring & 8mm LUL. >> stable on  CT 3/10 but new nodules Lt lung 7/10 >> Stable right upper lobe and left upper lobe irregular nodular densities.  Previously noted left lower lobe density resolved.   1/11 >> stable rt nodules, new LUL 3.1 cm patchy density, 8mm new lUL subpleural nodule  2. severe COPD FEV1 31% 6/09, DLCO 49%, high TLC c/w emphysema Describes chest cold in 12/08 requiring prednisone - made him feel like a 'different person'. 10/09 COPD exacerbation >> hospitalised  - improved remarkably with steroids again 11/09 has quit smoking since d/c Vaccines uptodate  3. Ischemic cardiomyopathy with EF 30-35% & high grade proximal lesion of LAD 2004 requiring stent, now further intervention planned.  Review of Systems       The patient complains of dyspnea on exertion.  The patient denies anorexia, fever, weight loss, weight gain, vision loss, decreased hearing, hoarseness, chest pain, syncope, peripheral edema, prolonged cough, headaches, hemoptysis, abdominal pain, melena, hematochezia, severe indigestion/heartburn, hematuria, suspicious skin lesions, difficulty walking, depression, unusual weight change, and abnormal bleeding.    Vital Signs:  Patient profile:   60 year old male Height:      64 inches Weight:      118 pounds O2 Sat:      98 % on Room air Temp:     97.9 degrees F oral Pulse rate:   65 / minute BP sitting:   114 / 76  (right arm) Cuff size:   regular  Vitals Entered By: Zackery Barefoot CMA (Jun 21, 2009 10:35 AM)  O2 Flow:  Room air CC: Pt here for follow up. Pt c/o increased SOB, rattles, tightness in chest, productive cough with clear mucus, and wheezing Comments Medications reviewed with patient Verified contact number and pharmacy with patient Zackery Barefoot CMA  Jun 21, 2009 10:37 AM    Physical Exam  Additional Exam:  Gen. Pleasant, well-nourished, in no distress ENT - no lesions, no post nasal drip Neck: No JVD, no thyromegaly, no carotid bruits Lungs: no use of accessory  muscles, no dullness to percussion, decreased BS bialterally, faint BL rhonchi Cardiovascular: Rhythm regular, heart sounds  normal, no murmurs or gallops, no peripheral edema Musculoskeletal: No deformities, no cyanosis or clubbing      Impression & Recommendations:  Problem # 1:  C O P D (ICD-496) ct advair, spiriva Add zyrtec for allergies Discussed as needed use of albuterol  Problem # 2:  PULMONARY NODULE (ICD-518.89) went over images & various options incl wait & watch - will proceed with PET to risk stratify - not a candidate for biopsy or resection. Orders: Est. Patient Level III ([address]) Radiology Referral (Radiology)  Medications Added to Medication List This Visit: 1)  Albuterol Sulfate (2.5 Mg/56ml) 0.083% Nebu (Albuterol sulfate) .Marland Kitchen.. 1 vial two times daily in nebulizer 2)  Zyrtec Allergy 10 Mg Caps (Cetirizine hcl) .... Once daily  Other Orders: Prescription  Created Electronically [id number])  Patient Instructions: 1)  Copy sent UE:[id number] dept 2)  Please schedule a follow-up appointment in 3 weeks after PEt scan 3)  A whole body PET Scan has been recommended.  Your imaging study may require preauthorization.  Prescriptions: ZYRTEC ALLERGY 10 MG CAPS (CETIRIZINE HCL) once daily  #30 x 1   Entered and Authorized by:   Comer Locket. Vassie Loll MD   Signed by:   Comer Locket Vassie Loll MD on 06/21/2009   Method used:   Electronically to        [facility]* (retail)       [address]       [place], [place]  [address]       Ph: [phone]       Fax: [phone]   RxID:   [id number]    Immunization History:  Pneumovax Immunization History:    Pneumovax:  historical (06/21/2007)

## 2010-03-19 NOTE — Assessment & Plan Note (Signed)
Summary: wheezing/ sob/ mbw   Copy to:  Dr.Degent Primary Provider/Referring Provider:  [facility]  CC:  Acute visit.  Pt c/o increased SOB x 1 wk.  He states that he is SOB walking very short distances.  Pt states that he still has some prod cough with brown sputum- pt feels this is worse since last ov on 01/23/09.  Marland Kitchen  History of Present Illness: 58/F, ex- smoker for evaln of pulm nodules & COPD.   April 05, 2008--Presents for an acute office visit. 2  week of cough, congestion, nasal drainage, increased dyspnea, wheezing. head ocngestion, chest congestion with prod cough with clear/white mucous, gets SOB with cough which causes panic attack x2 weeks.   3/10 FU of bronchitis & CT scan - Improved back to baseline with steroid burst. Taking nebs two times a day & xanax two times a day   8/10 >> FU after CT scan. Dyspnea at baseline, weight OK, xanax helps with anxiety. Tolerating lisinopril & bisoprolol.   January 23, 2009--Presents for 4 month follow up - pt c/o prod cough with brownish mucus that wakes him up during the night (pt also states that coffee helps to loosen the mucus which may explain the color).  Tolerating meds well. No hemoptysis, no weight loss. Last CT chest w/ recs for follow up in 1 year w/ no sign change in size of nodules. Denies chest pain,  orthopnea, hemoptysis, fever, n/v/d, edema, headache.   February 22, 2009--Presents for an Acute visit.  Pt c/o increased SOB x 1 wk.  He states that he is SOB walking very short distances.  Pt states that he still has some prod cough with brown sputum- pt feels this is worse since last ov on 01/23/09.  Cough has never went away. "I feel great except for this cough" , Cough can be severe at times. Keeping him up at night. Denies chest pain, dyspnea, orthopnea, hemoptysis, fever, n/v/d, edema, headache.   Preventive Screening-Counseling & Management  Alcohol-Tobacco     Smoking Status: quit > 6 months  Current Medications  (verified): 1)  Plavix 75 Mg  Tabs (Clopidogrel Bisulfate) .... Take One Tablet By Mouth Once Daily 2)  Ventolin Hfa 108 (90 Base) Mcg/act  Aers (Albuterol Sulfate) .Marland Kitchen.. 1-2 Puffs Every 4-6 Hours As Needed 3)  Bayer Aspirin 325 Mg Tabs (Aspirin) .... Take 1 Tablet By Mouth Once A Day 4)  Advair Diskus 250-50 Mcg/dose  Misc (Fluticasone-Salmeterol) .... Inhale 1 Puff Twice Daily. Rinse Mouth After Use. 5)  Albuterol Sulfate 0.63 Mg/60ml  Nebu (Albuterol Sulfate) .... Use Two Times A Day in Nebulizer. 6)  Lisinopril 2.5 Mg Tabs (Lisinopril) .... Take 1 Tablet By Mouth Once A Day 7)  Bisoprolol Fumarate 5 Mg Tabs (Bisoprolol Fumarate) .... 1/2 Tab By Mouth Once Daily 8)  Oxygen .... 2l As Needed 9)  Alprazolam 0.5 Mg Tabs (Alprazolam) .... Take One Tablet Every 12 Hours As Needed 10)  Spiriva Handihaler 18 Mcg Caps (Tiotropium Bromide Monohydrate) .Marland Kitchen.. 1 Puff Once Daily 11)  Nitroglycerin 0.4 Mg Subl (Nitroglycerin) .... Place 1 Tablet Under Tongue As Directed 12)  Red Yeast Rice 600 Mg Tabs (Red Yeast Rice Extract) .... Take 1 Tablet By Mouth Once A Day  Allergies (verified): No Known Drug Allergies  Past History:  Past Medical History: Last updated: 11/20/2008 TOBACCO ABUSE (ICD-305.1) - quit 10/09 PULMONARY NODULE (ICD-518.89) C O P D (ICD-496) Hx of MYOCARDIAL INFARCTION (ICD-410.90) HYPERTENSION (ICD-401.9) ASTHMA (ICD-493.90)  peripheral vascular  disease, distal aneurysm circumferential ending immediately above the iliac bifurcation, left iliac stenosis of 50%. Coronary artery disease severe single-vessel coronary arteries with mildly reduced ejection fraction EF 45% significant for her wall motion abnormality.  Chronically occluded right coronary artery.  1. Severe ischemic cardiomyopathy.     a.     EF 40-45% with inferior apical akinesis by 2-D echo, April      2009.     b.     EF 30-35% by cardiac catheterization in 2004.     c.     Status post NSTEMI/bare-metal stenting of  proximal LAD,      October 2004.     d.     Residual, chronic 100% distal RCA stenosis.     e.     Status post MI/PCI in 1989; HSRA/stenting in 1997 [facility]). 2. History of nonsustained ventricular tachycardia.     a.     Noninducible electrophysiology study in 2004. 3. Severe COPD/bullous emphysema.     a.     Longstanding tobacco smoking. 4. Pulmonary nodules.     a.     Confirmed by recent follow up chest CT scan. 5. Peripheral vascular disease.     a.     LLE claudication with 0.90 ABI; 0.94 on the right. 6. History of orthostatic hypotension. 7. Dyslipidemia.   Past Surgical History: Last updated: 07/27/2007 Angioplasty 2004  Past Pulmonary History:  Pulmonary History: 1. CXR at [facility] showed RUL nodule >> CT 6/09 showed multiple nodules as descibed  -of concern 7mm RUL with sub pleural scarring & 8mm LUL. >> stable on CT 3/10 but new nodules Lt lung 7/10 >> Stable right upper lobe and left upper lobe irregular nodular densities.  Previously noted left lower lobe density resolved.  Recommend follow-up CT in 1 year.  2. severe COPD FEV1 31% 6/09, DLCO 49%, high TLC c/w emphysema Describes chest cold in 12/08 requiring prednisone - made him feel like a 'different person'. 10/09 COPD exacerbation >> hospitalised  - improved remarkably with steroids again 11/09 has quit smoking since d/c Vaccines uptodate  3. Ischemic cardiomyopathy with EF 30-35% & high grade proximal lesion of LAD 2004 requiring stent, now further intervention planned.  Social History: Smoking Status:  quit > 6 months  Review of Systems      See HPI  Vital Signs:  Patient profile:   60 year old male Weight:      118.13 pounds O2 Sat:      98 % on Room air Temp:     98.0 degrees F oral Pulse rate:   68 / minute BP sitting:   100 / 70  (left arm)  Vitals Entered By: Vernie Murders (February 22, 2009 9:21 AM)  O2 Flow:  Room air CC: Acute visit.  Pt c/o increased SOB x 1 wk.  He states that he is SOB  walking very short distances.  Pt states that he still has some prod cough with brown sputum- pt feels this is worse since last ov on 01/23/09.   Is Patient Diabetic? No   Physical Exam  Additional Exam:  Gen. Pleasant, well-nourished, in no distress ENT - no lesions, no post nasal drip Neck: No JVD, no thyromegaly, no carotid bruits Lungs: no use of accessory muscles, no dullness to percussion, decreased BS bialterally, upper airway psuedowheezing.  Cardiovascular: Rhythm regular, heart sounds  normal, no murmurs or gallops, no peripheral edema Musculoskeletal: No deformities, no cyanosis or clubbing  Impression & Recommendations:  Problem # 1:  CHRONIC OBSTRUCTIVE PULMONARY DISEASE, ACUTE EXACERBATION (ICD-491.21) Flare w/ perisistent cough will check xray  pt is on ACE Inhibitor -suspect is keeping cough flared up. will hold for now and use low dose ARB instead  REC:  Stop Lisinopril  Beign Benicar 20mg  1/2 tab once daily (samples I gave you) I will call with xray results Prednisone taper over next week.  Use Mucinex DM two times a day as needed cough  Hydromet 1-2 tsp every 4-6 hr as needed cough  Prevacid once daily before meal.  follow up 3-4 weeks Dr. Vassie Loll  Please contact office for sooner follow up if symptoms do not improve or worsen   Problem # 2:  HYPERTENSION (ICD-401.9) well controlled on low dose meds will hold ace for now use low dose ARB-benicar 20mg  1/2 once daily  follow up 3-4 weeks  His updated medication list for this problem includes:    Benicar 20 Mg Tabs (Olmesartan medoxomil) .Marland Kitchen... 1/2 once daily    Bisoprolol Fumarate 5 Mg Tabs (Bisoprolol fumarate) .Marland Kitchen... 1/2 tab by mouth once daily  Medications Added to Medication List This Visit: 1)  Benicar 20 Mg Tabs (Olmesartan medoxomil) .... 1/2 once daily 2)  Hydromet 5-1.5 Mg/65ml Syrp (Hydrocodone-homatropine) .Marland Kitchen.. 1-2 tsp every 4-6 hrs as needed cough  Complete Medication List: 1)  Plavix 75 Mg  Tabs (Clopidogrel bisulfate) .... Take one tablet by mouth once daily 2)  Ventolin Hfa 108 (90 Base) Mcg/act Aers (Albuterol sulfate) .Marland Kitchen.. 1-2 puffs every 4-6 hours as needed 3)  Bayer Aspirin 325 Mg Tabs (Aspirin) .... Take 1 tablet by mouth once a day 4)  Advair Diskus 250-50 Mcg/dose Misc (Fluticasone-salmeterol) .... Inhale 1 puff twice daily. rinse mouth after use. 5)  Albuterol Sulfate 0.63 Mg/68ml Nebu (Albuterol sulfate) .... Use two times a day in nebulizer. 6)  Benicar 20 Mg Tabs (Olmesartan medoxomil) .... 1/2 once daily 7)  Bisoprolol Fumarate 5 Mg Tabs (Bisoprolol fumarate) .... 1/2 tab by mouth once daily 8)  Oxygen  .... 2l as needed 9)  Alprazolam 0.5 Mg Tabs (Alprazolam) .... Take one tablet every 12 hours as needed 10)  Spiriva Handihaler 18 Mcg Caps (Tiotropium bromide monohydrate) .Marland Kitchen.. 1 puff once daily 11)  Nitroglycerin 0.4 Mg Subl (Nitroglycerin) .... Place 1 tablet under tongue as directed 12)  Red Yeast Rice 600 Mg Tabs (Red yeast rice extract) .... Take 1 tablet by mouth once a day 13)  Hydromet 5-1.5 Mg/32ml Syrp (Hydrocodone-homatropine) .Marland Kitchen.. 1-2 tsp every 4-6 hrs as needed cough  Other Orders: T-2 View CXR ([id number])  Patient Instructions: 1)  Stop Lisinopril  2)  Beign Benicar 20mg  1/2 tab once daily (samples I gave you) 3)  I will call with xray results 4)  Prednisone taper over next week.  5)  Use Mucinex DM two times a day as needed cough  6)  Hydromet 1-2 tsp every 4-6 hr as needed cough  7)  Prevacid once daily before meal.  8)  follow up 3-4 weeks Dr. Vassie Loll  9)  Please contact office for sooner follow up if symptoms do not improve or worsen  Prescriptions: HYDROMET 5-1.5 MG/5ML SYRP (HYDROCODONE-HOMATROPINE) 1-2 tsp every 4-6 hrs as needed cough  #8 oz x 0   Entered and Authorized by:   Rubye Oaks NP   Signed by:   Kedarius Aloisi NP on 02/22/2009   Method used:   Print then Give to Patient   RxID:   [id number]  Appended Document: wheezing/  sob/ mbw reviewed CXR >> CT chest- no contrast & FU with me over next few wks  Appended Document: Orders Update    Clinical Lists Changes  Orders: Added new Referral order of Radiology Referral (Radiology) - Signed      Appended Document: wheezing/ sob/ mbw ATC pt at home number to advise that RA wants to go ahead and do the CT rather than wait for this summer.  LMOM TCB.  looking in the orders button, Almyra Free has already spoken with pt about the CT date/time.  will await his call to make sure pt understands.  Appended Document: wheezing/ sob/ mbw pt returned call.  advised why CT was rescheduled and verified that pt did receive the date/time.  pt verbalized his understanding.  Appended Document: Orders Update    Clinical Lists Changes  Orders: Added new Service order of New Patient Level IV ([address]109) - Signed

## 2010-03-19 NOTE — Medication Information (Signed)
Summary: [facility]   Imported By: Lester Orovada 12/25/2009 10:32:26  _____________________________________________________________________  External Attachment:    Type:   Image     Comment:   External Document

## 2010-03-19 NOTE — Medication Information (Signed)
Summary: [facility]   Imported By: Lester Kadoka 08/28/2009 10:49:50  _____________________________________________________________________  External Attachment:    Type:   Image     Comment:   External Document

## 2010-03-19 NOTE — Progress Notes (Signed)
SummaryPrudy [name] REFILL  Phone Note Call from Patient Call back at [facility] Phone [phone]   Caller: Patient Call For: [name] Summary of Call: NEEDS REFILL ON Aaron [name] AT [facility] Initial call taken by: Lacinda Axon,  August 21, 2009 12:58 PM  Follow-up for Phone Call        Dr. Vassie Loll, pt's last OV was 5.20.11 and has upcoming w/ TP 9.19.11.  may pt have refills alprazolam and how many?  thanks!    Follow-up by: Boone Master CNA/MA,  August 21, 2009 2:31 PM  Additional Follow-up for Phone Call Additional follow up Details #1::        pt called back again. wants "someone" to refill this today as he gets SOB due to anxiety. Tivis Ringer, CNA  August 21, 2009 3:37 PM    Additional Follow-up for Phone Call Additional follow up Details #2::    Spoke with pt and advised that we will need check with Dr Vassie Loll and see if this is okay before we refill b/c it is controlled med.  Pt aware RA out of the office until 08/22/09. Follow-up by: Vernie Murders,  August 21, 2009 4:11 PM  Additional Follow-up for Phone Call Additional follow up Details #3:: Details for Additional Follow-up Action Taken: OK x 3 refills until FU with TP  refills x3 called to [facility].  LM w/ family member that pt's requested refill has been called into the pharmacy. Boone Master CNA/MA  August 22, 2009 2:13 PM  Additional Follow-up by: Comer Locket. Vassie Loll MD,  August 22, 2009 1:25 PM  Prescriptions: ALPRAZOLAM 0.5 MG TABS (ALPRAZOLAM) Take one tablet every 12 hours as needed  #60 x 3   Entered by:   Boone Master CNA/MA   Authorized by:   Comer Locket. Vassie Loll MD   Signed by:   Boone Master CNA/MA on 08/22/2009   Method used:   Telephoned to ...       [facility]* (retail)       [address]       [place], [place]  [address]       Ph: [phone]       Fax: [phone]   RxID:   [id number]

## 2010-03-19 NOTE — Assessment & Plan Note (Signed)
Summary: ROV/ MBW   Visit Type:  Follow-up Copy to:  Dr.Degent Primary Provider/Referring Provider:  [facility]  CC:  Pt c/o increased SOB and rescue inhaler use.  History of Present Illness: 59/M, ex- smoker, quit '10 for FU of pulm nodules & COPD Gold stg 3 -FEV1 31% 1/11  CT chest>> stable rt nodules, new LUL 3.1 cm patchy density, 8mm new lUL subpleural nodule 4/11 1.6 cm LUL spiculation, PET SUV 2.0 On xanax for anxiety. 2 acute visits in dec 2010.  March 16, 2009 2:25 PM  TP changed lisinopril to benicar. Went to ER in [place] because of "panic attacks."  states he is still having thick mucus that is really hard to get up. Given steroid course.  Jul 06, 2009 4:44 PM  -with daughter to discuss results of PET  Stable on same dose of xanax, off prednisone, c/o runny nose. Breathing close to baseline.   December 24, 2009 1:54 PM   Abx in sep for bronchitic flare, now back to baseline  Ct chest shows new spiculated nodules in LUL, the one seen in april has shrunk in size from 16  to 11 mm. The short interval suggests ? inflammatory. Anxiety is controled  Denies chest pain,  orthopnea, hemoptysis, fever, n/v/d, edema, headache. Has upcoming follow up CT chest on 12/18/09 for lung nodules.   Preventive Screening-Counseling & Management  Alcohol-Tobacco     Smoking Status: quit     Smoking Cessation Counseling: yes     Packs/Day: <1/3 PPD     Year Started: 1962     Year Quit: 2010     Pack years: 34yrs, 8 cigs a day  Current Medications (verified): 1)  Plavix 75 Mg  Tabs (Clopidogrel Bisulfate) .... Take One Tablet By Mouth Once Daily 2)  Ventolin Hfa 108 (90 Base) Mcg/act  Aers (Albuterol Sulfate) .Marland Kitchen.. 1-2 Puffs Every 4-6 Hours As Needed 3)  Bayer Aspirin 325 Mg Tabs (Aspirin) .... Take 1 Tablet By Mouth Once A Day 4)  Advair Diskus 250-50 Mcg/dose  Misc (Fluticasone-Salmeterol) .... Inhale 1 Puff Twice Daily. Rinse Mouth After Use. 5)  Albuterol Sulfate (2.5  Mg/14ml) 0.083% Nebu (Albuterol Sulfate) .Marland Kitchen.. 1 Vial Two Times Daily in Nebulizer 6)  Benicar 20 Mg Tabs (Olmesartan Medoxomil) .... 1/2 Once Daily 7)  Bisoprolol Fumarate 5 Mg Tabs (Bisoprolol Fumarate) .... 1/2 Tab By Mouth Once Daily 8)  Oxygen .... 2l As Needed (Ahc) At Bedtime 9)  Alprazolam 0.5 Mg Tabs (Alprazolam) .... Take One Tablet Every 12 Hours As Needed 10)  Spiriva Handihaler 18 Mcg Caps (Tiotropium Bromide Monohydrate) .Marland Kitchen.. 1 Puff Once Daily 11)  Nitrostat 0.4 Mg Subl (Nitroglycerin) .Marland Kitchen.. 1 Tablet Under Tongue At Onset of Chest Pain; You May Repeat Every 5 Minutes For Up To 3 Doses. 12)  Red Yeast Rice 600 Mg Tabs (Red Yeast Rice Extract) .... Take 1 Tablet By Mouth Once A Day  Allergies (verified): No Known Drug Allergies  Past History:  Past Medical History: Last updated: 11/20/2008 TOBACCO ABUSE (ICD-305.1) - quit 10/09 PULMONARY NODULE (ICD-518.89) C O P D (ICD-496) Hx of MYOCARDIAL INFARCTION (ICD-410.90) HYPERTENSION (ICD-401.9) ASTHMA (ICD-493.90)  peripheral vascular disease, distal aneurysm circumferential ending immediately above the iliac bifurcation, left iliac stenosis of 50%. Coronary artery disease severe single-vessel coronary arteries with mildly reduced ejection fraction EF 45% significant for her wall motion abnormality.  Chronically occluded right coronary artery.  1. Severe ischemic cardiomyopathy.     a.     EF  40-45% with inferior apical akinesis by 2-D echo, April      2009.     b.     EF 30-35% by cardiac catheterization in 2004.     c.     Status post NSTEMI/bare-metal stenting of proximal LAD,      October 2004.     d.     Residual, chronic 100% distal RCA stenosis.     e.     Status post MI/PCI in 1989; HSRA/stenting in 1997 [facility]). 2. History of nonsustained ventricular tachycardia.     a.     Noninducible electrophysiology study in 2004. 3. Severe COPD/bullous emphysema.     a.     Longstanding tobacco smoking. 4. Pulmonary nodules.      a.     Confirmed by recent follow up chest CT scan. 5. Peripheral vascular disease.     a.     LLE claudication with 0.90 ABI; 0.94 on the right. 6. History of orthostatic hypotension. 7. Dyslipidemia.   Social History: Last updated: 07/06/2009 Divorced Disabled - lives with son worked as Education administrator -disabled Patient admits to prior drug use.  Patient states former smoker.   Past Pulmonary History:  Pulmonary History: 1. CXR at [facility] showed RUL nodule >> CT 6/09 showed multiple nodules as descibed  -of concern 7mm RUL with sub pleural scarring & 8mm LUL. >> stable on CT 3/10 but new nodules Lt lung 7/10 >> Stable right upper lobe and left upper lobe irregular nodular densities.  Previously noted left lower lobe density resolved.   1/11 >> stable rt nodules, new LUL 3.1 cm patchy density, 8mm new lUL subpleural nodule  2. severe COPD FEV1 31% 6/09, DLCO 49%, high TLC c/w emphysema 10/09 COPD exacerbation >> hospitalised  - improved remarkably with steroids again 11/09 has quit smoking since d/c Vaccines uptodate  Review of Systems       The patient complains of dyspnea on exertion.  The patient denies anorexia, fever, weight loss, weight gain, vision loss, decreased hearing, hoarseness, chest pain, syncope, peripheral edema, prolonged cough, headaches, hemoptysis, abdominal pain, melena, hematochezia, severe indigestion/heartburn, hematuria, muscle weakness, suspicious skin lesions, difficulty walking, depression, unusual weight change, abnormal bleeding, enlarged lymph nodes, and angioedema.    Vital Signs:  Patient profile:   60 year old male Height:      64 inches Weight:      119.4 pounds BMI:     20.57 O2 Sat:      97 % on Room air Temp:     98.5 degrees F oral Pulse rate:   65 / minute  Vitals Entered By: Zackery Barefoot CMA (December 24, 2009 1:42 PM)  O2 Flow:  Room air CC: Pt c/o increased SOB and rescue inhaler use Comments Medications reviewed with  patient Verified contact number and pharmacy with patient Zackery Barefoot CMA  December 24, 2009 1:42 PM    Physical Exam  Additional Exam:  wt 119 December 24, 2009  Gen. Pleasant, thin male , in no distress ENT - no lesions, no post nasal drip Neck: No JVD, no thyromegaly, no carotid bruits Lungs: coarse BS w/ no wheezing Cardiovascular: Rhythm regular, heart sounds  normal, no murmurs or gallops, no peripheral edema Musculoskeletal: No deformities, no cyanosis or clubbing      CT of Chest  Procedure date:  12/18/2009  Findings:      IMPRESSION:   1.  Interval decreased size and density of spiculated nodule medially  in the left upper lobe consistent with a resolving focus of inflammation. 2.  Interval development of two irregular spiculated nodules more laterally in the left upper lobe.  Although each of these is morphologically concerning for possible malignancy, the interval development and similarity with the resolving finding would favor an inflammatory process. 3.  No adenopathy or pleural effusion.  Diffuse emphysema is again noted.    Impression & Recommendations:  Problem # 1:  PULMONARY NODULE (ICD-518.89) Unfortunaltely new nodules keep cropping up even when CT is  done when stable (not having a flare) Due to the large size of these nodules, would FU in 3-4 months But sudden appearance favor inflammatory rather than malignancy Orders: Est. Patient Level IV (74259) Radiology Referral (Radiology)  Problem # 2:  C O P D (ICD-496)  Recent exacerbation now resolved.  ct meds ? pulm rehab in future  Problem # 3:  Anxiety better controled now on xanax  Medications Added to Medication List This Visit: 1)  Oxygen  .... 2l as needed (ahc) at bedtime  Patient Instructions: 1)  Please schedule a follow-up appointment in 4 months after CT scan 2)  A Chest CT with Contrast has been recommended.  Your imaging study may require preauthorization.

## 2010-03-19 NOTE — Assessment & Plan Note (Signed)
Summary: 6 MO FUL   Visit Type:  Follow-up Referring Provider:  Dr.Degent Primary Provider:  [facility]   History of Present Illness: the patient is a 60 year old male with a history of ischemic cardiomyopathy ejection fraction 40-45%. The patient had multiple prior procedures his last stent was a bare-metal stent to the LAD in October 2004 he also has chronic occlusion of the distal RCA. 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 He has severe COPD with bullous emphysema. He seen by pulmonary for recurrent pulmonary nodules which are felt to be inflammatory rather than malignant. The patient has frequent followup CT scans of the chest done.  Cardiac standpoint the patient is stable he denies any chest pain. He has stable dyspnea. 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 He is due for a follow up ultrasound in 6 months. The patient also has mildly decreased ABIs. He denies any orthopnea PND palpitations or syncope.  Preventive Screening-Counseling & Management  Alcohol-Tobacco     Smoking Status: quit  Comments: Quit smoking about 1-2 yrs ago, smoked for about 38 yrs  Current Medications (verified): 1)  Plavix 75 Mg  Tabs (Clopidogrel Bisulfate) .... Take One Tablet By Mouth Once Daily 2)  Ventolin Hfa 108 (90 Base) Mcg/act  Aers (Albuterol Sulfate) .Marland Kitchen.. 1-2 Puffs Every 4-6 Hours As Needed 3)  Bayer Aspirin 325 Mg Tabs (Aspirin) .... Take 1 Tablet By Mouth Once A Day 4)  Advair Diskus 250-50 Mcg/dose  Misc (Fluticasone-Salmeterol) .... Inhale 1 Puff Twice Daily. Rinse Mouth After Use. 5)  Albuterol Sulfate (2.5 Mg/60ml) 0.083% Nebu (Albuterol Sulfate) .Marland Kitchen.. 1 Vial Two Times Daily in Nebulizer 6)  Benicar 20 Mg Tabs (Olmesartan Medoxomil) .... 1/2 Once Daily 7)  Bisoprolol Fumarate 5 Mg Tabs (Bisoprolol Fumarate) .... 1/2 Tab By Mouth Once Daily 8)  Oxygen .... 2l As Needed (Ahc) At Bedtime 9)  Alprazolam 0.5 Mg Tabs  (Alprazolam) .... Take One Tablet Every 12 Hours As Needed 10)  Spiriva Handihaler 18 Mcg Caps (Tiotropium Bromide Monohydrate) .Marland Kitchen.. 1 Puff Once Daily 11)  Nitrostat 0.4 Mg Subl (Nitroglycerin) .Marland Kitchen.. 1 Tablet Under Tongue At Onset of Chest Pain; You May Repeat Every 5 Minutes For Up To 3 Doses. 12)  Red Yeast Rice 600 Mg Tabs (Red Yeast Rice Extract) .... Take 1 Tablet By Mouth Once A Day  Allergies: No Known Drug Allergies  Comments:  Nurse/Medical Assistant: The patient's medications were reviewed with the patient and were updated in the Medication List. Pt brought medication bottles to office visit.  Cyril Loosen, RN, BSN (January 11, 2010 10:55 AM)  Past History:  Past Medical History: Last updated: 11/20/2008 TOBACCO ABUSE (ICD-305.1) - quit 10/09 PULMONARY NODULE (ICD-518.89) C O P D (ICD-496) Hx of MYOCARDIAL INFARCTION (ICD-410.90) HYPERTENSION (ICD-401.9) ASTHMA (ICD-493.90)  peripheral vascular disease, distal aneurysm circumferential ending immediately above the iliac bifurcation, left iliac stenosis of 50%. Coronary artery disease severe single-vessel coronary arteries with mildly reduced ejection fraction EF 45% significant for her wall motion abnormality.  Chronically occluded right coronary artery.  1. Severe ischemic cardiomyopathy.     a.     EF 40-45% with inferior apical akinesis by 2-D echo, April      2009.     b.     EF 30-35% by cardiac catheterization in 2004.     c.     Status post NSTEMI/bare-metal stenting of proximal LAD,  October 2004.     d.     Residual, chronic 100% distal RCA stenosis.     e.     Status post MI/PCI in 1989; HSRA/stenting in 1997 [facility]). 2. History of nonsustained ventricular tachycardia.     a.     Noninducible electrophysiology study in 2004. 3. Severe COPD/bullous emphysema.     a.     Longstanding tobacco smoking. 4. Pulmonary nodules.     a.     Confirmed by recent follow up chest CT scan. 5. Peripheral vascular  disease.     a.     LLE claudication with 0.90 ABI; 0.94 on the right. 6. History of orthostatic hypotension. 7. Dyslipidemia.   Past Surgical History: Last updated: 07/27/2007 Angioplasty 2004  Family History: Last updated: 07/27/2007 Emphysema - brother Lung cancer - father - deceased 29  Social History: Last updated: 07/06/2009 Divorced Disabled - lives with son worked as Education administrator -disabled Patient admits to prior drug use.  Patient states former smoker.   Risk Factors: Smoking Status: quit (01/11/2010) Packs/Day: <1/3 PPD (12/24/2009)  Review of Systems       The patient complains of shortness of breath and prolonged cough.  The patient denies fatigue, malaise, fever, weight gain/loss, vision loss, decreased hearing, hoarseness, chest pain, palpitations, wheezing, sleep apnea, coughing up blood, abdominal pain, blood in stool, nausea, vomiting, diarrhea, heartburn, incontinence, blood in urine, muscle weakness, joint pain, leg swelling, rash, skin lesions, headache, fainting, dizziness, depression, anxiety, enlarged lymph nodes, easy bruising or bleeding, and environmental allergies.    Vital Signs:  Patient profile:   60 year old male Height:      64 inches Weight:      120.75 pounds Pulse rate:   67 / minute BP sitting:   106 / 71  (left arm) Cuff size:   regular  Vitals Entered By: Cyril Loosen, RN, BSN (January 11, 2010 10:52 AM) Comments Follow up visit. No cardiac complaints   Physical Exam  Additional Exam:  General: Well-developed, well-nourished in no distress head: Normocephalic and atraumatic eyes PERRLA/EOMI intact, conjunctiva and lids normal nose: No deformity or lesions mouth normal dentition, normal posterior pharynx neck: Supple, no JVD.  No masses, thyromegaly or abnormal cervical nodes lungs: decreased breath sounds but no wheezing.  Normal percussion heart: regular rate and rhythm with normal S1 and S2, no S3 or S4.  PMI is normal.  No  pathological murmurs abdomen: Normal bowel sounds, abdomen is soft and nontender without masses, organomegaly or hernias noted.  No hepatosplenomegaly musculoskeletal: Back normal, normal gait muscle strength and tone normal pulsus: 1+ peripheral pulses dorsalis pedis and posterior tibial pulse. Extremities: No peripheral pitting edema neurologic: Alert and oriented x 3 skin: Intact without lesions or rashes cervical nodes: No significant adenopathy psychologic: Normal affect    Impression & Recommendations:  Problem # 1:  AAA (ICD-441.4) we have scheduled a followup abdominal ultrasound in 6 months.  Problem # 2:  PERIPHERAL VASCULAR DISEASE (ICD-443.9) no definite claudication mildly decreased ABIs  Problem # 3:  CORONARY ATHEROSCLEROSIS NATIVE CORONARY ARTERY (ICD-414.01) patient continues on Plavix. Is on a good medical regimen and reports no chest pain. His updated medication list for this problem includes:    Plavix 75 Mg Tabs (Clopidogrel bisulfate) .Marland Kitchen... Take one tablet by mouth once daily    Bayer Aspirin 325 Mg Tabs (Aspirin) .Marland Kitchen... Take 1 tablet by mouth once a day    Bisoprolol Fumarate 5 Mg Tabs (Bisoprolol fumarate) .Marland KitchenMarland KitchenMarland KitchenMarland Kitchen  1/2 tab by mouth once daily    Nitrostat 0.4 Mg Subl (Nitroglycerin) .Marland Kitchen... 1 tablet under tongue at onset of chest pain; you may repeat every 5 minutes for up to 3 doses.  Problem # 4:  HYPERTENSION (ICD-401.9) blood pressure stable. Continue current medical regimen. His updated medication list for this problem includes:    Bayer Aspirin 325 Mg Tabs (Aspirin) .Marland Kitchen... Take 1 tablet by mouth once a day    Benicar 20 Mg Tabs (Olmesartan medoxomil) .Marland Kitchen... 1/2 once daily    Bisoprolol Fumarate 5 Mg Tabs (Bisoprolol fumarate) .Marland Kitchen... 1/2 tab by mouth once daily  Patient Instructions: 1)  Abdominal ultrasound in 6 months just before next visit 2)  Follow up in  6 months

## 2010-03-19 NOTE — Letter (Signed)
Summary: CT rescheduled  ---- Converted from flag ---- ---- 03/06/2009 2:51 PM, Boone Master CNA wrote: called spoke with patient.  he states that his CT was rescheduled to 03-09-09 @ 1045, with a follow up w/ RA 03-16-09 @2pm .  apologized to the patient and encouraged him to keep his upcoming appt.  called Rose at [facility] to verify date/time, which were correct.  ---- 03/05/2009 9:52 AM, Rubye Oaks NP wrote: did he go for CT ------------------------------

## 2010-03-19 NOTE — Progress Notes (Signed)
Summary: Call in levaquin 750 x 5 days  Phone Note Call from Patient Call back at [phone]   Caller: Patient Call For: Aaron [name] Summary of Call: coughing up green sputum [facility] Initial call taken by: Rickard Patience,  March 22, 2009 10:01 AM  Follow-up for Phone Call        RA patient--Pt states he saw Dr. Vassie Loll last week and was told if he was still having productive cough with green phlegm and SOB to call and he would send him in an abx. Pt states he still has productive cough with green phlegm, and SOB. Pt request abx. Please advise. Allergies: NKDA. Carron Curie CMA  March 22, 2009 10:09 AM Levaquin 750 x 5 days then ov with Vassie Loll to regroup Follow-up by: Nyoka Cowden MD,  March 22, 2009 11:47 AM  Additional Follow-up for Phone Call Additional follow up Details #1::        Pt informed that rx was sent to [facility].  Abigail Miyamoto RN  March 22, 2009 11:56 AM     New/Updated Medications: LEVAQUIN 750 MG TABS (LEVOFLOXACIN) Take one by mouth once daily until gone Prescriptions: LEVAQUIN 750 MG TABS (LEVOFLOXACIN) Take one by mouth once daily until gone  #5 x 0   Entered by:   Abigail Miyamoto RN   Authorized by:   Nyoka Cowden MD   Signed by:   Abigail Miyamoto RN on 03/22/2009   Method used:   Electronically to        [facility]* (retail)       [address]       [place], [place]  [address]       Ph: [phone]       Fax: [phone]   RxID:   [id number]

## 2010-03-21 NOTE — Progress Notes (Signed)
Summary: Xanax reill  Phone Note Call from Patient   Caller: Patient Call For: Dr. Vassie Loll Summary of Call: Patient phoned stated that his pharmacy faxed a refill request to our office yesterday and they did not get a response for [name]. He would like this refilled he uses [facility]. Patient can be reached at [phone]. Initial call taken by: Vedia Coffer,  February 20, 2010 10:59 AM  Follow-up for Phone Call        Patient aware refills called to [facility] and to make sure he follow-up after CT scan in March 2012 for further refills. Follow-up by: Michel Bickers CMA,  February 20, 2010 12:52 PM    Prescriptions: ALPRAZOLAM 0.5 MG TABS (ALPRAZOLAM) Take one tablet every 12 hours as needed  #60 x 1   Entered by:   Michel Bickers CMA   Authorized by:   Comer Locket. Vassie Loll MD   Signed by:   Michel Bickers CMA on 02/20/2010   Method used:   Telephoned to ...       [facility]* (retail)       [address]       [place], [place]  [address]       Ph: [phone]       Fax: [phone]   RxID:   [id number]

## 2010-04-29 ENCOUNTER — Ambulatory Visit (INDEPENDENT_AMBULATORY_CARE_PROVIDER_SITE_OTHER)
Admission: RE | Admit: 2010-04-29 | Discharge: 2010-04-29 | Disposition: A | Discharge status: Home / Self Care | Payer: Medicare Other | Source: Ambulatory Visit | Attending: Pulmonary Disease | Admitting: Pulmonary Disease

## 2010-04-29 DIAGNOSIS — R911 Solitary pulmonary nodule: Secondary | ICD-10-CM

## 2010-04-29 DIAGNOSIS — J984 Other disorders of lung: Secondary | ICD-10-CM

## 2010-04-29 HISTORY — DX: Chronic obstructive pulmonary disease, unspecified: J44.9

## 2010-04-29 MED ORDER — IOHEXOL 300 MG/ML  SOLN
80.0000 mL | Freq: Once | INTRAMUSCULAR | Status: AC | PRN
  Administered 2010-04-29: 80 mL via INTRAVENOUS

## 2010-05-02 ENCOUNTER — Ambulatory Visit (INDEPENDENT_AMBULATORY_CARE_PROVIDER_SITE_OTHER): Payer: Medicare Other | Admitting: Pulmonary Disease

## 2010-05-02 ENCOUNTER — Encounter: Payer: Self-pay | Admitting: Pulmonary Disease

## 2010-05-02 DIAGNOSIS — F411 Generalized anxiety disorder: Secondary | ICD-10-CM

## 2010-05-02 DIAGNOSIS — J449 Chronic obstructive pulmonary disease, unspecified: Secondary | ICD-10-CM

## 2010-05-02 DIAGNOSIS — J984 Other disorders of lung: Secondary | ICD-10-CM

## 2010-05-02 NOTE — Assessment & Plan Note (Addendum)
Pulm rehab referral, close to [place] Discussed trial of daliresp to decrease flares Zyrtec during spring

## 2010-05-02 NOTE — Assessment & Plan Note (Signed)
Stable on xanax

## 2010-05-02 NOTE — Progress Notes (Signed)
  Subjective:    Patient ID: Aaron [name], male    DOB: 16-Aug-1950, 60 y.o.   MRN: [id number]  HPI59/M, ex- smoker, quit '10 for FU of pulm nodules & COPD Gold stg 3 -FEV1 31% 1/11  CT chest>> stable rt nodules, new LUL 3.1 cm patchy density, 8mm new lUL subpleural nodule 4/11 1.6 cm LUL spiculation, PET SUV 2.0 On xanax for anxiety.  March 16, 2009 2:25 PM  TP changed lisinopril to benicar. Went to ER in [place] because of "panic attacks."  states he is still having thick mucus that is really hard to get up. Given steroid course.  December 24, 2009 1:54 PM   Abx in sep for bronchitic flare, now back to baseline  Ct chest shows new spiculated nodules in LUL, the one seen in april has shrunk in size from 16  to 11 mm. The short interval suggests ? Inflammatory.  05/02/2010  Reviewed CT chest - improved irregular nodules BULs, likely inflammatory. Feels more dyspneic, may be 'allergies', denies cough, occasional wheeze discused pulm rehab & side effects of daliresp, Using albuterol nebs & mDI 3-4 /day     Review of Systems Pt denies any significant  nasal congestion or excess secretions, fever, chills, sweats, unintended wt loss, pleuritic or exertional cp, orthopnea pnd or leg swelling.  Pt also denies any obvious fluctuation in symptoms with weather or environmental change or other alleviating or aggravating factors.    Pt denies any increase in rescue therapy over baseline, denies waking up needing it or having early am exacerbations or coughing/wheezing/ or dyspnea       Objective:   Physical Exam Gen. Pleasant, well-nourished, in no distress, normal affect ENT - no lesions, no post nasal drip Neck: No JVD, no thyromegaly, no carotid bruits Lungs: no use of accessory muscles, no dullness to percussion, decreased without rales or rhonchi  Cardiovascular: Rhythm regular, heart sounds  normal, no murmurs or gallops, no peripheral edema Abdomen: soft and non-tender, no  hepatosplenomegaly, BS normal. Musculoskeletal: No deformities, no cyanosis or clubbing Neuro:  alert, non focal        Assessment & Plan:

## 2010-05-02 NOTE — Assessment & Plan Note (Signed)
Rpt CT in 1 yr

## 2010-05-07 ENCOUNTER — Encounter: Payer: Self-pay | Admitting: Pulmonary Disease

## 2010-05-07 LAB — GLUCOSE, CAPILLARY: Glucose-Capillary: 98 mg/dL (ref 70–99)

## 2010-05-07 NOTE — Assessment & Plan Note (Signed)
Summary: OV AFTER CT//SH   Visit Type:  Follow-up Copy to:  Dr.Degent Primary Provider/Referring Provider:  [facility]  CC:  CT follow up.  History of Present Illness: 59/M, ex- smoker, quit '10 for FU of pulm nodules & COPD Gold stg 3 -FEV1 31% 1/11  CT chest>> stable rt nodules, new LUL 3.1 cm patchy density, 8mm new lUL subpleural nodule 4/11 1.6 cm LUL spiculation, PET SUV 2.0 On xanax for anxiety.  March 16, 2009 2:25 PM  TP changed lisinopril to benicar. Went to ER in [place] because of "panic attacks."  states he is still having thick mucus that is really hard to get up. Given steroid course.  December 24, 2009 1:54 PM   Abx in sep for bronchitic flare, now back to baseline  Ct chest shows new spiculated nodules in LUL, the one seen in april has shrunk in size from 16  to 11 mm. The short interval suggests ? inflammatory.   May 02, 2010 5:11 PM  see note in epic  Preventive Screening-Counseling & Management  Alcohol-Tobacco     Smoking Status: quit     Smoking Cessation Counseling: yes     Packs/Day: <1/3 PPD     Year Started: 16     Year Quit: 2010     Pack years: 99yrs, 8 cigs a day  Current Medications (verified): 1)  Plavix 75 Mg  Tabs (Clopidogrel Bisulfate) .... Take One Tablet By Mouth Once Daily 2)  Ventolin Hfa 108 (90 Base) Mcg/act  Aers (Albuterol Sulfate) .Marland Kitchen.. 1-2 Puffs Every 4-6 Hours As Needed 3)  Bayer Aspirin 325 Mg Tabs (Aspirin) .... Take 1 Tablet By Mouth Once A Day 4)  Advair Diskus 250-50 Mcg/dose  Misc (Fluticasone-Salmeterol) .... Inhale 1 Puff Twice Daily. Rinse Mouth After Use. 5)  Albuterol Sulfate (2.5 Mg/83ml) 0.083% Nebu (Albuterol Sulfate) .Marland Kitchen.. 1 Vial Two Times Daily in Nebulizer 6)  Benicar 20 Mg Tabs (Olmesartan Medoxomil) .... 1/2 Once Daily 7)  Bisoprolol Fumarate 5 Mg Tabs (Bisoprolol Fumarate) .... 1/2 Tab By Mouth Once Daily 8)  Oxygen .... 2l As Needed (Ahc) At Bedtime 9)  Alprazolam 0.5 Mg Tabs (Alprazolam) ....  Take One Tablet Every 12 Hours As Needed 10)  Spiriva Handihaler 18 Mcg Caps (Tiotropium Bromide Monohydrate) .Marland Kitchen.. 1 Puff Once Daily 11)  Nitrostat 0.4 Mg Subl (Nitroglycerin) .Marland Kitchen.. 1 Tablet Under Tongue At Onset of Chest Pain; You May Repeat Every 5 Minutes For Up To 3 Doses. 12)  Red Yeast Rice 600 Mg Tabs (Red Yeast Rice Extract) .... Take 1 Tablet By Mouth Once A Day  Allergies (verified): No Known Drug Allergies  Past Pulmonary History:  Pulmonary History: 1. CXR at [facility] showed RUL nodule >> CT 6/09 showed multiple nodules as descibed  -of concern 7mm RUL with sub pleural scarring & 8mm LUL. >> stable on CT 3/10 but new nodules Lt lung 7/10 >> Stable right upper lobe and left upper lobe irregular nodular densities.  Previously noted left lower lobe density resolved.   1/11 >> stable rt nodules, new LUL 3.1 cm patchy density, 8mm new lUL subpleural nodule  2. severe COPD FEV1 31% 6/09, DLCO 49%, high TLC c/w emphysema 10/09 COPD exacerbation >> hospitalised  - improved remarkably with steroids again 11/09 has quit smoking since d/c Vaccines uptodate  Vital Signs:  Patient profile:   60 year old male Height:      64 inches Weight:      120.4 pounds BMI:  20.74 O2 Sat:      97 % on Room air Temp:     98.0 degrees F oral Pulse rate:   79 / minute BP sitting:   120 / 72  (left arm) Cuff size:   regular  Vitals Entered By: Zackery Barefoot CMA (May 02, 2010 4:37 PM)  O2 Flow:  Room air CC: CT follow up Comments Medications reviewed with patient Verified contact number and pharmacy with patient Zackery Barefoot CMA  May 02, 2010 4:37 PM     Other Orders: Est. Patient Level IV (04540) Rehabilitation Referral (Rehab)  Patient Instructions: 1)  Copy sent to: dr Laural Benes 2)  Please schedule a follow-up appointment in 2 months. 3)  Trial of daliresp - to prevent flares- we discussed side effects of diarrhea & weight loss 4)  Take ZYRTEC once daily for allergies 5)   We have recommended that you participate in Pulmonary Rehabilitation Program.  You will need your exercise prescription and copy of your evaluation to be considered for participation.

## 2010-05-07 NOTE — Progress Notes (Signed)
  Subjective:    Patient ID: Aaron [name], male    DOB: [birth year], 61 y.o.   MRN: [id number]  HPI    Review of Systems  Constitutional: Negative for fever, appetite change and unexpected weight change.  HENT: Negative for ear pain, congestion, sore throat, rhinorrhea, sneezing, trouble swallowing and postnasal drip.   Eyes: Negative for redness.  Respiratory: Negative for cough, shortness of breath and wheezing.   Cardiovascular: Negative for chest pain, palpitations and leg swelling.  Gastrointestinal: Negative for nausea, vomiting, abdominal pain and diarrhea.  Genitourinary: Negative for dysuria and urgency.  Musculoskeletal: Negative for joint swelling.  Skin: Negative for rash.  Neurological: Negative for syncope and headaches.  Hematological: Does not bruise/bleed easily.  Psychiatric/Behavioral: Negative for dysphoric mood. The patient is not nervous/anxious.        Objective:   Physical Exam        Assessment & Plan:

## 2010-05-21 ENCOUNTER — Other Ambulatory Visit: Payer: Self-pay | Admitting: Pulmonary Disease

## 2010-06-04 ENCOUNTER — Telehealth: Payer: Self-pay | Admitting: Pulmonary Disease

## 2010-06-04 MED ORDER — ALBUTEROL SULFATE (2.5 MG/3ML) 0.083% IN NEBU: 2.5000 mg | INHALATION_SOLUTION | Freq: Two times a day (BID) | RESPIRATORY_TRACT | Status: DC

## 2010-06-04 NOTE — Telephone Encounter (Signed)
[email or web address]  

## 2010-06-04 NOTE — Telephone Encounter (Signed)
Pt returned my call and stated that he needed the neb meds sent in for refills. This has been sent to pts pharmacy

## 2010-06-20 ENCOUNTER — Other Ambulatory Visit: Payer: Self-pay | Admitting: Pulmonary Disease

## 2010-06-21 ENCOUNTER — Telehealth: Payer: Self-pay | Admitting: Pulmonary Disease

## 2010-06-21 MED ORDER — FLUTICASONE-SALMETEROL 250-50 MCG/DOSE IN AEPB: 1.0000 | INHALATION_SPRAY | Freq: Two times a day (BID) | RESPIRATORY_TRACT | Status: DC

## 2010-06-21 MED ORDER — ALBUTEROL SULFATE HFA 108 (90 BASE) MCG/ACT IN AERS: 2.0000 | INHALATION_SPRAY | Freq: Four times a day (QID) | RESPIRATORY_TRACT | Status: DC | PRN

## 2010-06-21 MED ORDER — TIOTROPIUM BROMIDE MONOHYDRATE 18 MCG IN CAPS: 18.0000 ug | ORAL_CAPSULE | Freq: Every day | RESPIRATORY_TRACT | Status: DC

## 2010-06-21 NOTE — Telephone Encounter (Signed)
Spoke w. Pt and advised rx's was sent to pharmacy. Pt verbalized understanding and nothing further was needed

## 2010-07-02 NOTE — Assessment & Plan Note (Signed)
[facility]                          [facility] OFFICE NOTE   RIDDIK, SENNA                     MRN:          [id number]  DATE:06/23/2007                            DOB:          [birth year]    PRIMARY CARDIOLOGIST:  Learta Codding, MD.   REASON FOR VISIT:  Scheduled clinic followup.  Please refer to Dr.  Ival Bible recent office note of April 9, for full details.   When patient was recently seen here by Dr. Diona Browner, he was placed back  on a beta blocker with bisoprolol 2.5 daily, for treatment of his  previously documented severe ischemic cardiomyopathy.  He was also  placed on Pravachol 40 daily.  A 2-D echocardiogram was also ordered for  reassessment of left ventricular function.  This was previously  documented as 30-35%, at time of his percutaneous intervention in 2004  when he presented with a NSTEMI.  The current 2-D echo shows significant  improvement with an estimated EF of 40-45%.  There were no significant  valvular abnormalities.   Clinically, Mr. Poffenberger continues to report no chest pain.  He states  that he has not had any of the chest discomfort which was clearly  associated with his MI in 2004.  He does, however, have chronic  exertional dyspnea, but continues to smoke nonetheless.  He also  suggests symptoms of left lower extremity claudication and has not been  previously evaluated for such.   CURRENT MEDICATIONS:  1. Bisoprolol 2.5 daily.  2. Pravastatin 40 daily.  3. Plavix.  4. Aspirin 81 daily.  5. Advair.  6. Terazosin 2 q.h.s.  7. Ranitidine 150 daily.   PHYSICAL EXAMINATION:  VITAL SIGNS:  Blood pressure 108/68, pulse 62  regular, weight 126, sats 98% on room air.  GENERAL:  A 60 year old male, sitting upright, in no distress.  HEENT:  Normocephalic, atraumatic.  NECK:  Normal carotid pulses without bruits; no JVD.  LUNGS:  Markedly diminished breath sounds throughout with mild rhonchi;  no wheezes.  HEART:  Regular rate and rhythm (S1,S2) with diminished heart sounds.  No significant murmur.  ABDOMEN:  Soft, nontender with intact bowel sounds.  EXTREMITIES:  Palpable right posterior tibialis pulse; nonpalpable left  posterior tibialis pulse; palpable bilateral femoral pulses with left  femoral bruit; no pedal edema.  NEURO:  No focal deficit.   IMPRESSION:  1. Ischemic cardiomyopathy.      a.     Ejection fraction currently improved to 40-45%.      b.     History of ejection fraction 30-35% by cardiac       catheterization in 2004.      c.     Status post NSTEMI/bare-metal stenting of proximal LAD,       October 2004.      d.     Residual chronic 100% distal RCA stenosis.      e.     Status post myocardial infarction/percutaneous intervention       in 1989; HSRA/stenting in 1997 [facility]).  2. History of nonsustained ventricular tachycardia.      a.  Noninducible electrophysiology study in 2004.  3. History of orthostatic hypotension.  4. COPD/emphysema, longstanding tobacco.  5. Right upper lobe nodule.  6. Intermittent claudication, left femoral bruit.   PLAN:  1. Surveillance exercise stress Cardiolite for risk stratification.      It appears the patient has not had any ischemic workup since      undergoing percutaneous intervention in 2004.  This should be      scheduled on medical therapy.  2. Lower extremity arterial Dopplers (segmental) to rule out      significant peripheral vascular disease.  I suspect the patient      does have at least moderate atherosclerotic disease of the left      lower extremity, based both on history and physical examination      findings.  3. The patient strongly advised to stop smoking tobacco.  He seems to      suggest that he was on some prescription medication in the recent      past, but had to stop secondary to associated dizziness.  He also      suggests he previously has tried various modalities, including      nicotine patch and  hypnosis, but with no apparent success.  We      spoke at length about the importance of smoking cessation.  4. CT scan of the chest (with contrast) in followup of a recent      abnormal chest x-ray, here at [facility], ordered by the ED      department.  This suggested a 6 mm RUL nodule, which appeared to be      a new finding.  5. Schedule return clinic followup with myself and Dr. Andee Lineman in 1      month for review of these various studies and further      recommendations.      Gene Serpe, PA-C  Electronically Signed      Learta Codding, MD,FACC  Electronically Signed   GS/MedQ  DD: 06/23/2007  DT: 06/23/2007  Job #: [id number]   cc:   Dierdre Forth, MD

## 2010-07-02 NOTE — Assessment & Plan Note (Signed)
[facility]                          [facility]   Aaron [name], Aaron [name]                     MRN:          [id number]  DATE:07/23/2007                            DOB:          1951/01/25    PRIMARY CARDIOLOGIST:  Learta Codding, MD.   REASON FOR VISIT:  Aaron [name] returns to the clinic in followup of a  recent adenosine stress Cardiolite, which was interpreted by Dr. Andee Lineman  as a high-risk study.   The stress test was ordered by me when the patient was just recently  seen here in followup on April 9.  I ordered this as a screening study  for risk stratification, given that the patient had not had any ischemic  workup since undergoing percutaneous intervention in 2004.  At that  time, he presented with a NSTEMI and underwent bare-metal stenting of a  high-grade proximal LAD lesion.  He was also found to have total  occlusion of the distal RCA and 50% stenosis of the mid CFX artery.  LV  function was 30-35%, at that time.   During a recent followup here in the clinic, the patient was referred  for a 2-D echo, by Dr. Diona Browner, for reassessment of his left  ventricular function.  It was the first time that he had returned to our  clinic since last seen here in November 2004.  The recent 2-D echo  showed some improvement in LV function, with an estimated EF of 40-45%,  but with numerous wall motion abnormalities including akinesis of the  mid inferior/apical inferior wall.  There were no significant valvular  abnormalities.   I also ordered lower extremity Dopplers, given his complaint of left  lower extremity claudication.  This was also in the context of a noted  left groin bruit on physical examination, as well as diminished  peripheral pulses.  The segmental ABIs revealed 0.94 on the right and  0.90 on the left, with biphasic waveforms throughout both lower  extremities.   I also ordered a CT scan of the chest, given that  patient had a recent  abnormal chest x-ray here in the [facility] ED Department.  This  had suggested a 6 mm RUL nodule, which appeared to be a new finding.  The follow up CT scan revealed severe bullous emphysema and, in addition  to the noted RUL nodule, a 5 mm LUL nodule as well as a separate LUL  nodular opacity (8 mm), and a 3 mm left apical nodule.  Further  evaluation with CT PET scan was suggested, as well as a follow up CT  scan in 3 months.  The patient was subsequently referred to our  pulmonary team in [facility] and is scheduled to follow up with them on  July 27, 2007, with Dr. Vassie Loll.   Clinically, the patient denies any interim development of chest pain  since last seen here in the clinic.  In fact, he states he has not had  any chest pain since his last myocardial infarction in 2004.  He does,  however, note some worsening shortness of breath  with exertion, but  denies PND, orthopnea, lower extremity edema.  He continues to have left  lower extremity claudication.   The perfusion imaging study, reviewed by Dr. Andee Lineman, was interpreted as  a high-risk study with a significantly increased TID ratio (1.3).  There  was a large, partially reversible apical/inferobasal defect associated  with severe hypokinesis; there was also a second, medium, fixed  inferobasal defect.  Calculated ejection fraction was 42%.   The results of the study were reviewed with the patient in full.  Recommendation is to proceed with cardiac catheterization, and the  patient has agreed.   ALLERGIES:  NO KNOWN DRUG ALLERGIES.   CURRENT MEDICATIONS:  1. Aspirin 81 daily.  2. Plavix.  3. Bisoprolol 2.5 daily.  4. Pravastatin 40 daily.  5. Ranitidine 150 b.i.d.  6. Albuterol nebulizer b.i.d.  7. Advair 250/50 mg b.i.d.   PAST MEDICAL HISTORY:  1. Severe ischemic cardiomyopathy.      a.     EF 40-45% with inferior apical akinesis by 2-D echo, April       2009.      b.     EF 30-35% by  cardiac catheterization in 2004.      c.     Status post NSTEMI/bare-metal stenting of proximal LAD,       October 2004.      d.     Residual, chronic 100% distal RCA stenosis.      e.     Status post MI/PCI in 1989; HSRA/stenting in 1997 [facility]).  2. History of nonsustained ventricular tachycardia.      a.     Noninducible electrophysiology study in 2004.  3. Severe COPD/bullous emphysema.      a.     Longstanding tobacco smoking.  4. Pulmonary nodules.      a.     Confirmed by recent follow up chest CT scan.  5. Peripheral vascular disease.      a.     LLE claudication with 0.90 ABI; 0.94 on the right.  6. History of orthostatic hypotension.  7. Dyslipidemia.   PAST SURGICAL HISTORY:  None.   SOCIAL HISTORY:  The patient lives with his son.  He is on total  disability.  He is currently down to 4 cigarettes a day.  Drinks an  occasional beer.   FAMILY HISTORY:  Noncontributory for premature coronary artery disease.   REVIEW OF SYSTEMS:  As noted per HPI, remaining systems negative.   PHYSICAL EXAMINATION:  VITAL SIGNS:  Blood pressure 145/86, pulse 61  regular, weight 124.6.  GENERAL:  A 60 year old male, sitting upright, in no distress.  HEENT:  Normocephalic, atraumatic.  NECK:  Palpable bilateral pulses without bruits; no JVD.  LUNGS:  Markedly diminished breath sounds with mild expiratory wheezes;  no crackles.  HEART:  Regular rate and rhythm (S1,S2) with markedly diminished heart  sounds.  No significant murmur.  ABDOMEN:  Soft, nontender with intact bowel sounds.  No bruits.  EXTREMITIES:  Palpable bilateral femoral pulses with left femoral bruit;  palpable right posterior tibialis pulse; nonpalpable left posterior  tibialis pulse; no pedal edema.  NEURO:  No focal deficit.   IMPRESSION:  Aaron [name] is a pleasant 60 year old male with severe  ischemic cardiomyopathy, who denies any development of recurrent angina  pectoris since being treated for  a NSTEMI in 2004.   He was recently referred for a surveillance ischemic  study, performed as an adenosine stress test, which was interpreted, by  Dr. Andee Lineman, as a high-risk study.  He noted a high TID ratio (1.3) and a  large, partially reversible inferobasal defect associated with severe  hypokinesis, consistent with ischemia, and with evidence of prior  myocardial infarction.  Calculated ejection fraction was 42%.  The  patient, however, had no inducible chest pain and there were no  significant EKG changes during the study.   The patient presents with no symptoms suggestive of decompensated heart  failure.  His most recent echo this past April suggested an EF of 40-  45%, with no significant valvular abnormalities.  The patient also has  peripheral vascular disease with reproducible left lower extremity  claudication.  He denies any symptoms on the right.  The patient has  also been diagnosed with bilateral lung nodules by recent follow up CT  scan, and is scheduled for a pulmonary evaluation by our team in  [facility] on July 27, 2007, with Dr. Vassie Loll.   PLAN:  Following review with Dr. Andee Lineman, recommendation is to proceed  with a diagnostic coronary angiogram and possible percutaneous  intervention.  The patient is agreeable with this recommendation and the  risks/benefits of the study were discussed.  We will arrange to have  this done early next week in our [facility] catheterization lab.  Of note,  recommendation would be to perform this with a distal aortogram, as to  better assess the extent of his peripheral vascular disease.   The patient is to continue on current medication regimen, with up  titration of aspirin to full dose, and is to continue Plavix.  He will  also be provided with a new prescription for nitroglycerin.      Gene Serpe, PA-C  Electronically Signed      Learta Codding, MD,FACC  Electronically Signed   GS/MedQ  DD: 07/23/2007  DT: 07/23/2007  Job #: [id number]   cc:   ATTN:   Dierdre Forth, FNP [facility]

## 2010-07-02 NOTE — Discharge Summary (Signed)
[name], [name]              ACCOUNT NO.:  [id number]   MEDICAL RECORD NO.:  [id number]          PATIENT TYPE:  INP   LOCATION:  4705                         FACILITY:  [facility]   PHYSICIAN:  Oretha Milch, MD      DATE OF BIRTH:  1950/06/06   DATE OF ADMISSION:  11/22/2007  DATE OF DISCHARGE:  11/27/2007                               DISCHARGE SUMMARY   PRIORITY DISCHARGE SUMMARY   DISCHARGE DIAGNOSES:  1. Chronic obstructive pulmonary disease exacerbation with      continuation of tobacco abuse.  2. Bronchitis.  3. Pseudo wheeze.  4. Increased white count.   HISTORY OF PRESENT ILLNESS:  [name] is a 60 year old male,  who has chronic obstructive pulmonary disease with an FEV1 noted to be  35% on July 27, 2007.  He also has ischemic cardiomyopathy with an  ejection fraction of 30 to 35% with a high-grade proximal lesion in the  LAD in 2004 requiring a stent.  He presented to the office with  increasing respiratory distress and after being treated for exacerbation  of his COPD, improvement refractory to treatment as an outpatient and  with known lung nodules that were treated by the appearance, he was  admitted for further evaluation and treatment.   LABORATORY DATA:  WBC is 20.9, hemoglobin is 16.9, hematocrit 50.8,  platelets are 259.  ESR is 2, INR is 0.0, PTT is 24.  Sodium 138,  potassium 4.4, chloride is 102, CO2 is 27, glucose 103, BUN is 11,  creatinine is 1.10.  Total protein 6.3, albumin 3.8, AST is 18, ALT is  14, __________is 66, total bilirubin is 1.0, B-type natriuretic peptide  is 129.  Urine culture showed insignificant growth.  Chest x-ray shows  no acute disease and stable hyperinflation.  CT of the chest shows no  pulmonary embolus, changes of moderate severe COPD, peripheral  micronodular and tree-in Bud have become progressive suggestive of  airway infection or inflammation.   HOSPITAL COURSE AND DISCHARGE DIAGNOSES:  1. Chronic obstructive  pulmonary disease exacerbation with continued      tobacco abuse.  He was treated with intravenous steroids,      intravenous antibiotics, and nebulized bronchodilators.  He was      transitioned to p.o. prednisone and to p.o. medications.  He was      also started on DuoNebs and continuation of his Advair.  He had not      reached maximal hospital benefit but had threatened to sign out      against medical advice, and he was discharged against medical      advice on November 26, 2007.  He is to follow up with the nurse      practitioner, Delice Bison, in the office on October 23rd at 10 a.m.  2. Bronchitis.  He will complete a total of 7 days of Avelox.  He also      will complete a steroid taper and chest x-ray followup in the near      future.  We will consider Mycobacterium Avium-Intracellulare (MAC)  as a possible differential diagnosis and will be needed to follow      up his pulmonary nodules on an outpatient basis.  3. Pseudo-wheeze.  He was on an ARB, an angiotensin receptor blocker,      and beta blocker.  We will monitor.  He will be on lisinopril as an      outpatient.  4. Elevated white count.  Most likely secondary to steroids.  We will      follow him on an outpatient basis if he follows up with his office      visit as instructed.   DISCHARGE MEDICATIONS:  1. Plavix 75 mg daily.  2. Ventolin-HFA 1 to 2 puffs daily.  3. Aspirin 81 mg daily.  4. Advair Discus 250/150, one puff 2 times a day.  5. Albuterol __________163 mg nebulizer daily.  6. Lisinopril/hydrochlorothiazide 20/50 daily.  7. Spiriva 18 mcg inhaled daily.  8. Bupropion 150 mg q.12 hours.  9. Isorbid 5 mg one-half daily.  10.Avelox 400 mg daily.  11.Prednisone on a taper at 40 mg for 4 days, 30 mg for 4 days, 20 mg      for 4 days, 10 mg a day and stay until he is further evaluated.  12.Home O2 at 2 L 24 hours a day.   FOLLOWUP:  He has a followup appointment tentatively on October 23rd at  10 a.m.    DISPOSITION AND LAST CONDITION ON DISCHARGE:  Shortness of breath and  hypoxia remained elevated.  He was discharged after threatening to sign  out AMA.      Devra Dopp, MSN, ACNP      Oretha Milch, MD  Electronically Signed    SM/MEDQ  D:  01/14/2008  T:  01/14/2008  Job:  [id number]

## 2010-07-02 NOTE — Assessment & Plan Note (Signed)
[facility]                          [facility] NOTE   [name], Aaron [name]                       MRN:          [id number]  DATE:05/27/2007                            DOB:          [birth year]    PRIMARY CARDIOLOGIST:  Dr. Lewayne Bunting.   PRIMARY CARE:  [facility].   REASON FOR VISIT:  Reestablished cardiology follow-up.   HISTORY OF PRESENT ILLNESS:  Aaron [name] is [facility] back [facility]  His last listed office visit was in 2004  based on chart review.  He has a history of ischemic cardiomyopathy  status post previous non-ST elevation myocardial infarction with  subsequent bare metal stent placement to the proximal left anterior  descending in October 2004 and findings of a chronically occluded distal  right coronary artery that has been managed medically.  Ejection  fraction has been as low as 30-35% and there is also a history of  nonsustained ventricular tachycardia resulting an electrophysiology  study which revealed non inducible ventricular tachycardia and therefore  no device placement.  He has a history of tobacco abuse and obstructive  lung disease and also hyperlipidemia with a reported intolerance to  Zocor.  Recently he reports having an upper respiratory tract  infection and has had cough with a chest discomfort mainly exacerbated  with his cough.  I see that he was seen in the emergency department  recently and treated with prednisone and doxycycline.  He reports  feeling much better.  He otherwise has no clear exertional component to  his chest pain.  His electrocardiogram today showed  sinus rhythm with  inferior Q-waves, nonspecific ST-T wave changes, not markedly different  from previous tracing.  He is no longer on beta-blocker therapy citing  problems with Toprol in the past due to weakness and he not on an ACE  inhibitor.  I notice a previous diagnosis of  orthostatic hypotension  which apparently to some degree has limited his medical therapy in the  past.  He has not had any recent follow-up of his ejection fraction or  his lipids.  His LDL was 135 as of February 2006.   ALLERGIES:  No known drug allergies.   CURRENT MEDICATIONS:  1. Albuterol inhaler q.4 h p.r.n.  2. Combivent inhaler p.r.n.  3. Plavix 75 mg p.o. daily.  4. Aspirin 81 mg p.o. daily.  5. Advair 250/50 b.i.d.  6. Mucus relief 400 mg p.o. q.8 h.  7. Terazosin 20 mg p.o. q.h.s.  8. Doxycycline 100 mg p.o. b.i.d.  9. Bupropion SR 150 mg p.o. b.i.d.  10.Zantac 150 mg p.o. daily.  11.Sublingual nitroglycerin 0.4 mg p.r.n.   REVIEW OF SYSTEMS:  As described in the history of present illness. He  has had no palpitations or syncope.  No orthopnea or PND.  No lower  extremity edema.   PHYSICAL EXAMINATION:  Blood pressure is 137/93, heart rate is 74,  weight is 123.4 pounds which is relatively stable since 2004.  The  patient is in no acute distress.  HEENT:  Conjunctiva is  normal.  Pharynx clear.  NECK:  Supple.  No elevated jugular venous pressure, no loud bruits, no  thyromegaly was noted.  LUNGS:  Diminished breath sounds.  No active wheezing or rales.  CARDIAC:  A regular rate and rhythm.  No S4, no loud systolic murmur or  rub.  ABDOMEN:  Soft, nontender, normal active bowel sounds.  EXTREMITIES:  Significant pitting edema. Distal pulses are 1+.  SKIN:  Warm and dry.  MUSCULOSKELETAL:  No kyphosis noted.  NEUROPSYCHIATRIC:  The patient is alert and oriented x3. Affect seems  appropriate.   IMPRESSION AND RECOMMENDATIONS:  1. History of ischemic cardiomyopathy as outlined above, status post      bare metal stent placement to the left anterior descending in 2004      and with previously documented ejection fraction of 30-35%.  At      this point, we will plan to add bisoprolol 2.5 mg daily and proceed      with a 2-D echocardiogram for reassessment of left  ventricular      systolic function.  Hopefully he will be able to better tolerate      therapy and perhaps an ACE inhibitor can be added along the way.  I      will have him follow-up with Dr. Andee Lineman over the next month to      establish more regular care.  2. History of hyperlipidemia.  Will begin a trial of Pravachol 40 mg      p.o. q.h.s. and plan a follow-up lipid profile and liver function      tests over the next 8 weeks.  3. Upper respiratory tract infection status post treatment with      doxycycline and prednisone.  The patient states he is feeling much      better.  It may be that some of his recent chest tightness was more      related to infection/chronic obstructive lung disease and frank      angina.  His electrocardiogram looks relatively stable.     Jonelle Sidle, MD  Electronically Signed    SGM/MedQ  DD: 05/27/2007  DT: 05/27/2007  Job #: [id number]   cc:   Learta Codding, MD,FACC

## 2010-07-02 NOTE — Assessment & Plan Note (Signed)
Aaron [name]                          [facility]   Aaron [name], Aaron [name]                     Aaron [name]:          [id number]  DATE:08/03/2007                            DOB:          September 21, 1950    PRIMARY CARDIOLOGIST:  Learta Codding, MD, [facility].   REASON FOR VISIT:  Postcatheterization followup.   Aaron [name] was recently referred for an elective cardiac  catheterization following a high-risk adenosine stress test, which I had  recently ordered as a surveillance study.  Dr. Andee Lineman interpreted a high  TID ratio (1.3) and noted a large, partially reversible inferobasal  defect associated with severe hypokinesis, consistent with ischemia, and  with evidence of prior infarction; EF of 42%.   The cardiac catheterization, however, suggested no significant change  since his previous study of 2004.  Specifically, the proximal LAD stent  site remained widely patent.  There was known, 100% occlusion of the  proximal RCA with distal collateralization.  EF was approximately 45%  with inferior akinesis.  This was similar to a recent 2D echo study,  suggesting an EF of 40-45%.   During the coronary angiogram, there was a suggestion of a distal,  circumferential abdominal aortic aneurysm.  This was thus followed up  with an abdominal ultrasound which suggested a fusiform, infrarenal AAA  (3.1 x 3.1 cm), with a length of 5 cm.   Aaron [name] continues to report no chest pain, as he had in the past.  He is down to four cigarettes a day.  He denies any complications of the  right groin incision site.   We also referred Aaron [name] for a formal pulmonary evaluation with our  [facility].  He is being followed by Dr. Vassie Loll and is due to  return in 4 months, with a repeat CT scan of the chest.  A recent study  suggested an  RUL spiculated opacity, as well as similar findings in the  left upper lobe.  According to Aaron [name], Dr. Vassie Loll suggested that  it  was too small at this point to be sure if this represented a nodule  versus scarring.   CURRENT MEDICATIONS:  Bisoprolol 2.5 daily, pravastatin 40 nightly,  ranitidine 150 b.i.d., Spiriva, aspirin, Plavix, Advair, and albuterol  nebulizer.   PHYSICAL EXAMINATION:  VITAL SIGNS:  Blood pressure 124/77, pulse 62 and  regular, weight 123.8.  GENERAL:  A 60 year old male, sitting upright, in no distress.  HEENT:  Normocephalic, atraumatic.  NECK:  Palpable carotid pulses without bruits; no JVD.  LUNGS:  Diminished breath sounds, without crackles or wheezes.  HEART:  Regular rate and rhythm  (S1 and S2), with markedly diminished  heart sounds.  No significant murmur.  ABDOMEN:  Soft, nontender, intact bowel sounds.  No bruits.  EXTREMITIES:  Stable right groin with no hematoma, ecchymosis, or bruit  on auscultation.  Minimally palpable right posterior tibialis pulse.  No  edema.  NEURO:  No focal deficits.   IMPRESSION:  1. Severe ischemic cardiomyopathy.      a.     Stable coronary anatomy by recent catheterization, revealing  continued wide patency of the proximal left anterior descending       stent site; chronic, 100% occlusion of the proximal right coronary       artery with distal collateralization.      b.     Ejection fraction of 45%.      c.     Status post non-ST elevation myocardial infarction/bare       metal stenting to proximal left anterior descending, October 2004.      d.     Status post MI/percutaneous coronary intervention in 1989;       high-speed rotational atherectomy/stenting in 1997 [name]).  2. Small abdominal aortic aneurysm.  3. Severe chronic obstructive pulmonary disease/bullous emphysema.      a.     Ongoing tobacco.  4. Bilateral lung nodules.      a.     Followed by Dr. Vassie Loll in [place].  5. Dyslipidemia.      a.     Started on pravastatin this past April.  6. Peripheral arterial disease.      a.     Recent ankle-brachial indices:  0.94  right; 0.90 left.  7. History of orthostatic hypotension.  8. History of nonsustained ventricular tachycardia.      a.     Noninducible electrophysiology study in 2004.  9. History of Toprol intolerance.   PLAN:  1. Add ACE inhibitor with lisinopril 2.5 mg daily, for continued      optimization of medical management.  We are introducing a very low      dose, given his current normal blood pressure and history of      orthostatic hypotension.  2. Followup BMET in 1 week, following challenge with ACE inhibitor.  3. Surveillance FLP/LFTs in 1 week.  4. Recommend a followup abdominal ultrasound in 1 year.  5. Schedule return clinic followup with myself and Dr. Andee Lineman in the      following 3-4 months.     Gene Serpe, PA-C  Electronically Signed      Learta Codding, MD,FACC  Electronically Signed   GS/MedQ  DD: 08/03/2007  DT: 08/04/2007  Job #: [id number]

## 2010-07-02 NOTE — Cardiovascular Report (Signed)
NAME:  CICERO, NOY NO.:  [id number]   MEDICAL RECORD NO.:  [id number]          PATIENT TYPE:  OIB   LOCATION:  1965                         FACILITY:  [facility]   PHYSICIAN:  Rollene Rotunda, MD, FACCDATE OF BIRTH:  1951/01/29   DATE OF PROCEDURE:  07/29/2007  DATE OF DISCHARGE:  07/29/2007                            CARDIAC CATHETERIZATION   PRIMARY:  Roanna Epley, nurse practitioner, [facility]  Department   PROCEDURE:  Left heart catheterization.   ASSESSMENT:  Coronary arteriography.   INDICATIONS:  Patient with known coronary artery disease with previous  occlusion of his right coronary artery and ischemic cardiomyopathy.  He  had a stress perfusion studies suggesting balanced ischemia of the risk  exam.  There was evidence of inferior infarct with peri-infarct  ischemia.   PROCEDURE NOTE:  Left heart catheterization was performed via the right  femoral artery.  The artery was cannulated using the anterior wall  puncture.  A #4-French arterial sheath was inserted via the modified  Seldinger technique.  Preformed Judkins and pigtail catheter was  utilized.  The patient tolerated the procedure well and left the lab in  stable condition.   RESULTS:  Hemodynamics 143/25, AO 143/76.  Coronaries:  The left main  was normal.  The LAD had a proximal stent.  This was widely patent.  There were proximal mild calcifications.  There was mid calcification  with focal 30% stenosis.  There were distal and apical luminal  irregularities.  The first diagonal was moderate-sized with mid 25%  stenosis.  Second and third diagonal was small with minimal  irregularities.  Circumflex in the AV groove was normal.  First obtuse  marginal was large and normal.  OM-2 was moderate-sized and normal.  The  right coronary artery was a dominant vessel and was occluded proximally.  There were collaterals from the septal perforator and circumflex.  Left  ventriculogram:   The left ventriculogram was obtained in the RAO  projection reveals 45% with inferior akinesis.  Aortogram:  This  aortogram was obtained secondary to difficulty advancing the guidewire  and decreased peripheral pulses and documented reduced ABIs.  The  patient had diffuse irregularities in his aorta.  There was a distal  aneurysm circumferential ending immediately above the iliac bifurcation.  The left iliac had 50% stenosis.   CONCLUSION:  Severe single-vessel coronary artery disease.  Mildly  reduced ejection fraction with significant inferior wall motion  abnormality.  Abdominal aortic aneurysm.   PLAN:  The patient will continue to have risk reduction.  We talked  about the need to stop smoking.  No intervention is planned.  He will  get his abdominal aneurysm sized with an abdominal ultrasound.      Rollene Rotunda, MD, [facility]  Electronically Signed     JH/MEDQ  D:  07/29/2007  T:  07/30/2007  Job:  [id number]   cc:   Learta Codding, MD,FACC

## 2010-07-05 NOTE — Cardiovascular Report (Signed)
NAME:  Aaron [name], Aaron [name] NO.:  [id number]   MEDICAL RECORD NO.:  [id number]                   PATIENT TYPE:  INP   LOCATION:  2116                                 FACILITY:  [facility]   PHYSICIAN:  Veneda Melter, M.D.                   DATE OF BIRTH:  [birth year]   DATE OF PROCEDURE:  12/05/2002  DATE OF DISCHARGE:                              CARDIAC CATHETERIZATION   PROCEDURES PERFORMED:  1. Left heart catheterization.  2. Left ventriculogram.  3. Selective coronary angiography.  4. PTCA and stent placement proximal left anterior descending.   DIAGNOSES:  1. Two vessel coronary artery disease.  2. Moderate left ventricular systolic dysfunction.  3. Ventricular tachycardia.  4. Non ST elevation myocardial infarction.   HISTORY:  Aaron [name] is a 60 year old gentleman with a known history of  coronary disease status post interventions at [facility] who presents with substernal chest discomfort and ventricular  tachycardia.  The patient was seen at [facility], admitted, and  subsequently stabilized.  He was transferred to [facility].  He has had  recurrent ventricular tachycardia and elevation of cardiac troponin  enzymes/ECG showing T-wave inversion in the anterolateral leads.  He has  known occlusion of the right coronary artery.   TECHNIQUE:  Informed consent was obtained.  The patient was brought to the  catheterization laboratory.  A 6-French sheath placed in the right femoral  artery using modified Seldinger technique.  A 6-French JL4 and JR4 catheter  was then used to engage the left and right coronary arteries.  Selective  angiography performed in various projections using manual injections of  contrast.  A 6-French pigtail catheter was advanced in the left ventricle  and left ventriculogram performed using power injections of contrast.   FINDINGS:  1. Left main trunk:  Small caliber vessel with mild  irregularities.  2. LAD is a large caliber vessel that provides several diagonal branches in     the mid section.  The LAD has a tubular high grade narrowing of 70% in     the proximal segment.  This appears to be dynamic as when we later     engaged the guide catheter it appeared to be 90% stenosed.  The mid and     distal vessel has mild disease of 30% as do the diagonal branches.  3. Left circumflex artery is a medium caliber vessel.  Provides two marginal     branches.  The mid AV circumflex has a moderate narrowing of 40-50%.  4. Right coronary artery appears to have been a dominant vessel.  This is a     small caliber vessel.  Provides a trivial posterior descending artery     distally.  The right coronary artery is 100% occluded distally.  The PDA     fills via antegrade and retrograde collaterals and appears under filled  in the small caliber vessel.  5. LV:  Mildly dilated end-systolic and end-diastolic dimensions.  Overall     left ventricular function appears at least moderately impaired.  Ejection     fraction approximately 30-35%.  there is hypokinesis of the mid distal,     anterior, and apical walls with akinesis at the basal mid inferior walls.     No mitral regurgitation is appreciated.  LV pressure is 100/10.  Aortic     is 100/70.  LVEDP equals 30.   These findings were reviewed in detail with the patient with treatment  options including surgical versus percutaneous revascularization.  The  patient opted for percutaneous revascularization.  He had been previously  treated with heparin and Integrilin and heparin was supplemented to maintain  ACT of greater than 250 seconds.  A 6-French CLS3.5 guide catheter was used  to engage the left coronary artery.  A 0.014 inch Forte wire advanced in the  LAD.  This was used to size the vessel diameter and lesion length.  The wire  was advanced distally and a 3.0 x 8 mm Express-2 stent introduced.  This was  carefully positioned  in the proximal segment of the LAD extending into the  ostium and just prior to the diagonal branch was deployed at 10 atmospheres  for 45 seconds.  A 3.5 x 8 mm Quantum Maverick balloon was then used to post  dilate the stent at 18 atmospheres for 60 seconds.  Repeat angiography was  performed after administration of intracoronary nitroglycerin showing an  excellent result with no residual stenosis, TIMI 3 flow through the LAD, and  no distal vessel damage.  The guide catheter was then removed and the sheath  secured in position.  The patient tolerated procedure well and was  transferred to the floor in stable condition.   FINAL RESULTS:  Successful PTCA and stent placement proximal left anterior  descending with reduction of 70% narrowing to 0% with placement of 3.0 x 8  mm Express-2 stent dilated to 3.5 mm.   ASSESSMENT/PLAN:  Aaron [name] is a 60 year old gentleman who presents with  ventricular tachycardia, non ST elevation myocardial infarction.  He has  known occlusion of the right coronary artery and new high grade narrowing of  the proximal left anterior descending.  The patient will be placed on Plavix  for a minimum of one month and possibly longer.  Aggressive risk factor  modification will be pursued and the patient has been told in no uncertain  terms stop smoking and drinking.  Will reassess left ventricular function in  four to six weeks as well as place Holter monitor to assess for significant  ectopy.  Should he continue to have significant left ventricular dysfunction  or significant dysrhythmia, electrophysiologic consult may be warranted.                                               Veneda Melter, M.D.    Melton Alar  D:  12/05/2002  T:  12/05/2002  Job:  [id number]   cc:   Learta Codding, M.D.  [address]  [address]  [place]  [place] [address]   Mikey Kirschner, M.D.  [facility]

## 2010-07-05 NOTE — Discharge Summary (Signed)
NAME:  Aaron [name], Aaron [name] NO.:  [id number]   MEDICAL RECORD NO.:  [id number]                   PATIENT TYPE:  INP   LOCATION:  3729                                 FACILITY:  [facility]   PHYSICIAN:  Willa Rough, M.D.                  DATE OF BIRTH:  [birth year]   DATE OF ADMISSION:  12/04/2002  DATE OF DISCHARGE:  12/08/2002                           DISCHARGE SUMMARY - REFERRING   PROCEDURES:  Elective coronary artery stenting, December 05, 2002;  electrophysiology study.   REASON FOR ADMISSION:  Please refer to dictated admission note.   LABORATORY DATA:  Normal hemoglobin, platelets with marginally elevated WBC  on admission.  Normal electrolytes and renal function. Cardiac enzymes:  Negative CPK-MB; peak troponin-I 0.22, on admission.  Lipid profile: Total  cholesterol of 159, triglycerides 139, HDL 35, LDL 96. BNP 58. TSH 1.99.   HOSPITAL COURSE:  Following stabilization at [facility] where the  patient presented with unstable angina pectoris and ventricular tachycardia,  the patient was maintained on a medication regimen consisting of aspirin,  beta blocker, intravenous nitroglycerin, and IV Integrilin. Serial cardiac  markers were negative for CPK-MB with only mild elevation of the troponin  (0.22).  Peak troponin-I was 0.26 noted during hospitalization.   Of note, the patient did develop hypotension necessitating discontinuation  of ACE inhibitor. This was not resumed prior to discharge.   The patient underwent diagnostic coronary angiography the following day by  Dr. Lenis Noon (see reports for full details) revealing a 70% proximal LAD  lesion. He had known history of total occlusion of the RCA. He had a  residual 50% mid AV/CFX lesion. Moderate LVD was noted (EF of 30% to 35%).  Dr. Chales Abrahams proceeded with stenting (non-DES) of the LAD lesion with no noted  complications. Dr. Chales Abrahams recommended Plavix for at least one month  (reassessment of  LV function four to six weeks). The patient was  subsequently referred to Dr. Sherryl Manges for evaluation of recurrent  ventricular tachycardia. Dr. Graciela Husbands recommended proceeding with  electrophysiology study for risk stratification. This revealed no evidence  of inducible ventricular tachycardia and the patient was discharged the  following day on medical therapy.   MEDICATIONS AT DISCHARGE:  1. Plavix 75 mg daily (for at least one month).  2. Enteric-coated aspirin 81 mg daily.  3. Zocor 40 mg q.h.s.  4. Toprol XL 25 mg daily.  5. Albuterol nebulizer p.r.n.  6. Combivent MDI two puffs q.i.d.  7. Nitroglycerin 0.4 mg p.r.n.   DISCHARGE INSTRUCTIONS:  No strenuous activity/heavy lifting. No driving  times one week. Low-fat/cholesterol diet.  Call the office if there is any  swelling or bleeding.   The patient is scheduled to follow up with Dr. Charyl Dancer on Thursday, January 05, 2003, at 10 a.m. in our [place] office. He is also scheduled for a blood  pressure check on Thursday, December 15, 2002, at 9:45.    DISCHARGE DIAGNOSES:  1. Non-ST-elevation myocardial infarction.     A. Status post elective stent (non-DES) to proximal left anterior        descending on December 05, 2002.     B. Residual chronic 100% right coronary artery.     C. Moderate left ventricular dysfunction (ejection fraction 30% to 35%).     D. Status post remote myocardial infarction/PCI 61; HSRA/stent in 1997        [facility]).  2. Recurrent nonsustained ventricular tachycardia.     A. Non inducible by electrophysiology study.  3. Dyslipidemia/low HDL.  4. Tobacco.  5. Chronic obstructive pulmonary disease/emphysema.      Gene Serpe, P.A. [facility]                      Willa Rough, M.D.    GS/MEDQ  D:  12/08/2002  T:  12/08/2002  Job:  [id number]   cc:   [facility]  [address], [address]  [place], [place] [address]

## 2010-07-19 ENCOUNTER — Other Ambulatory Visit: Payer: Self-pay | Admitting: Pulmonary Disease

## 2010-08-19 ENCOUNTER — Other Ambulatory Visit: Payer: Self-pay | Admitting: Pulmonary Disease

## 2010-09-13 ENCOUNTER — Encounter: Payer: Self-pay | Admitting: Cardiology

## 2010-09-16 ENCOUNTER — Ambulatory Visit (INDEPENDENT_AMBULATORY_CARE_PROVIDER_SITE_OTHER): Payer: Medicare Other | Admitting: Pulmonary Disease

## 2010-09-16 ENCOUNTER — Encounter: Payer: Self-pay | Admitting: Pulmonary Disease

## 2010-09-16 DIAGNOSIS — J449 Chronic obstructive pulmonary disease, unspecified: Secondary | ICD-10-CM

## 2010-09-16 DIAGNOSIS — J984 Other disorders of lung: Secondary | ICD-10-CM

## 2010-09-16 DIAGNOSIS — F411 Generalized anxiety disorder: Secondary | ICD-10-CM

## 2010-09-16 MED ORDER — BUSPIRONE HCL 15 MG PO TABS: 15.0000 mg | ORAL_TABLET | Freq: Every day | ORAL | Status: DC

## 2010-09-16 NOTE — Patient Instructions (Addendum)
Get back to pulmonary rehab Trial of Buspirone for anxiety

## 2010-09-16 NOTE — Progress Notes (Signed)
  Subjective:    Patient ID: Aaron [name], male    DOB: 02-09-1951, 60 y.o.   MRN: [id number]  HPI 59/M, ex- smoker, quit '10 for FU of pulm nodules & COPD Gold stg 3 -FEV1 31%  On xanax for anxiety.   March 16, 2009  -changed lisinopril to benicar.   09/16/2010 daliresp caused diarrhea Started Pulm rehab x 2 weeks - then caught a cold.  Feels more dyspneic, may be 'allergies', denies cough, occasional wheeze   Using albuterol nebs & mDI 3-4 /day   Pulmonary History:  1. CXR at [facility] showed RUL nodule >> CT 6/09 showed multiple nodules as descibed -of concern 7mm RUL with sub pleural scarring & 8mm LUL. >> stable on CT 3/10 but new nodules Lt lung  7/10 >> Stable right upper lobe and left upper lobe irregular nodular densities. Previously noted left lower lobe density resolved.  1/11 >> stable rt nodules, new LUL 3.1 cm patchy density, 8mm new lUL subpleural nodule  1/11 CT chest>> stable rt nodules, new LUL 3.1 cm patchy density, 8mm new lUL subpleural nodule  4/11 1.6 cm LUL spiculation, PET SUV 2.0  3/12 Reviewed CT chest - improved irregular nodules BULs, likely inflammatory.  2. severe COPD FEV1 31% 6/09, DLCO 49%, high TLC c/w emphysema  10/09 COPD exacerbation >> hospitalised - improved remarkably with steroids again  11/09  quit smoking since d/c  Vaccines uptodate   Review of Systems Patient denies significant dyspnea,cough, hemoptysis,  chest pain, palpitations, pedal edema, orthopnea, paroxysmal nocturnal dyspnea, lightheadedness, nausea, vomiting, abdominal or  leg pains      Objective:   Physical Exam  Gen. Pleasant, well-nourished, in no distress ENT - no lesions, no post nasal drip Neck: No JVD, no thyromegaly, no carotid bruits Lungs: no use of accessory muscles, no dullness to percussion,deceased without rales or rhonchi  Cardiovascular: Rhythm regular, heart sounds  normal, no murmurs or gallops, no peripheral edema Musculoskeletal: No deformities,  no cyanosis or clubbing        Assessment & Plan:   No problem-specific assessment & plan notes found for this encounter.

## 2010-09-17 NOTE — Assessment & Plan Note (Signed)
Trial of buspa 1 qday to try & decrease xanax use

## 2010-09-17 NOTE — Assessment & Plan Note (Signed)
First noted in bilat upper lobes in 6/09, but new nodules keep appearing  resolving on serial CTs. On last CT in march'12 appearto be  inflammatory & scarring. Will rpt surveillance CT in 3/13

## 2010-09-17 NOTE — Assessment & Plan Note (Signed)
Ok to stop daliresp Resume pulm rehab

## 2010-09-20 ENCOUNTER — Other Ambulatory Visit: Payer: Self-pay | Admitting: Pulmonary Disease

## 2010-09-20 ENCOUNTER — Encounter: Payer: Self-pay | Admitting: Cardiology

## 2010-09-20 ENCOUNTER — Ambulatory Visit (INDEPENDENT_AMBULATORY_CARE_PROVIDER_SITE_OTHER): Payer: Medicare Other | Admitting: Cardiology

## 2010-09-20 ENCOUNTER — Other Ambulatory Visit: Payer: Self-pay | Admitting: *Deleted

## 2010-09-20 VITALS — BP 139/88 | HR 60 | Ht 64.0 in | Wt 115.0 lb

## 2010-09-20 DIAGNOSIS — E785 Hyperlipidemia, unspecified: Secondary | ICD-10-CM

## 2010-09-20 DIAGNOSIS — Z951 Presence of aortocoronary bypass graft: Secondary | ICD-10-CM

## 2010-09-20 DIAGNOSIS — I739 Peripheral vascular disease, unspecified: Secondary | ICD-10-CM

## 2010-09-20 DIAGNOSIS — I2510 Atherosclerotic heart disease of native coronary artery without angina pectoris: Secondary | ICD-10-CM

## 2010-09-20 DIAGNOSIS — I10 Essential (primary) hypertension: Secondary | ICD-10-CM

## 2010-09-20 DIAGNOSIS — I714 Abdominal aortic aneurysm, without rupture: Secondary | ICD-10-CM

## 2010-09-20 MED ORDER — CLOPIDOGREL BISULFATE 75 MG PO TABS: 75.0000 mg | ORAL_TABLET | Freq: Every day | ORAL | Status: DC

## 2010-09-20 NOTE — Assessment & Plan Note (Signed)
No recurrent chest pain status post prior multiple procedures to the LAD stent was a bare-metal stent in LAD October 2004 and chronic occlusion of the distal RCA. Echocardiogram shows mild hypokinesis but ejection fraction remains stable at 40-45% and the patient has no heart failure symptoms.

## 2010-09-20 NOTE — Patient Instructions (Signed)
   Ultrasound of abdomen  Your physician recommends that you go to the [facility] for lab work lipids & liver function.  Reminder:  Nothing to eat or drink after 12 midnight prior to labs. Your physician wants you to follow up in: 6 months.  You will receive a reminder letter in the mail one-two months in advance.  If you don't receive a letter, please call our office to schedule the follow up appointment

## 2010-09-20 NOTE — Assessment & Plan Note (Signed)
Unable to tolerate statin drug therapy. Patient is tolerating red yeast rice. We will followup with a lipid panel and liver function tests.

## 2010-09-20 NOTE — Assessment & Plan Note (Signed)
Mild peripheral vascular disease with no new complaints of claudication

## 2010-09-20 NOTE — Assessment & Plan Note (Signed)
Patient will need a followup abdominal aortic aneurysm scan. This will be scheduled today.

## 2010-09-20 NOTE — Progress Notes (Signed)
HPI The patient is a 60 year old male with a history of ischemic cardiomyopathy ejection fraction 40-45%. The patient had multiple prior procedures which last stent was a bare-metal stent in LAD in October 2004 and a chronic occlusion of the distal RCA. He has a history of noninducible ventricular tachycardia by EP study. He has severe COPD with bolus emphysema and is seen by pulmonary and is also followed for pulmonary nodules which are felt to be inflammatory rather than malignant. The patient is doing well from a cardiac standpoint. He denies any chest pain. He has stable dyspnea. He does have an aneurysm of the abdominal aorta measures 3.2 x 3.3 cm. He was supposed to have a followup prior to this visit but this did not happen. The patient is also unable to tolerate statins but is doing well with red yeast rice. We have no recent lipid panel and cholesterol panel however. He has mild peripheral vascular disease with mildly decreased ABIs. A limited bedside echo was performed today. The patient's ejection fraction remains stable at 40-45% the study was very limited because of his COPD and images were obtained from the subcostal view. He doesn't lateral hypokinesis there is mild mitral regurgitation mild tricuspid regurgitation. RV function appears to be normal.  No Known Allergies  Current Outpatient Prescriptions on File Prior to Visit  Medication Sig Dispense Refill  . albuterol (PROVENTIL) (2.5 MG/3ML) 0.083% nebulizer solution Take 3 mLs (2.5 mg total) by nebulization 2 (two) times daily. 1 vial two times daily in nebulizer  75 mL  6  . albuterol (VENTOLIN HFA) 108 (90 BASE) MCG/ACT inhaler Inhale 2 puffs into the lungs every 6 (six) hours as needed.  1 Inhaler  3  . ALPRAZolam (XANAX) 0.5 MG tablet TAKE 1 TABLET BY MOUTH EVERY 12 HOURS AS NEEDED  60 tablet  3  . aspirin 325 MG tablet Take 325 mg by mouth daily.        Marland Kitchen BENICAR 20 MG tablet TAKE 1/2 TABLET BY MOUTH DAILY  15 tablet  1  .  bisoprolol (ZEBETA) 5 MG tablet Take by mouth. 1/2 tablet daily      . busPIRone (BUSPAR) 15 MG tablet Take 1 tablet (15 mg total) by mouth daily.  30 tablet  0  . clopidogrel (PLAVIX) 75 MG tablet Take 75 mg by mouth daily.        . Fluticasone-Salmeterol (ADVAIR DISKUS) 250-50 MCG/DOSE AEPB Inhale 1 puff into the lungs every 12 (twelve) hours.  60 each  3  . nitroGLYCERIN (NITROSTAT) 0.4 MG SL tablet Place 0.4 mg under the tongue every 5 (five) minutes as needed.        . Red Yeast Rice 600 MG CAPS Take by mouth. Once daily       . tiotropium (SPIRIVA) 18 MCG inhalation capsule Place 1 capsule (18 mcg total) into inhaler and inhale daily.  30 capsule  3    Past Medical History  Diagnosis Date  . COPD (chronic obstructive pulmonary disease)     severe; bullos emphysema. longstanding tobacco smoking   . Tobacco abuse   . Pulmonary nodule   . History of heart attack   . Hypertension   . Asthma   . PVD (peripheral vascular disease)     distal aneuysm cicumfeerential ending immed above iliac bifurcation, Lt iliac stenosis of 50%. LLE claudication with 0.90 ABI; 0.94 on rt  . CAD (coronary artery disease)     severe single-vessel coronary arteries w/mildly reduced EF 45%  sginif. for her wall motion abnml. chronically occluded rt coronary artery.   . Ischemic cardiomyopathy     severe; EF 40-45% w/inferior apical akenesis by 2D echo 4/09. EF 30-35% by cardiac cath in '04. s/p MSTEMI/bare metal stenting of prximal LAD 10/04. residual, chronic 100% distal RCA stenosis. s/p MI/PCI  in [address]; HRSA/stenting in 1996 [facility])  . VT (ventricular tachycardia)     hx of nonsustained; noninducible electophysiology study in 2004  . Orthostatic hypotension     hx  . Dyslipidemia     Past Surgical History  Procedure Date  . Angioplasty 2004    Family History  Problem Relation Age of Onset  . Emphysema Brother   . Lung cancer Father     History   Social History  . Marital Status: Divorced     Spouse Name: N/A    Number of Children: N/A  . 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 Smoking status: Former Smoker -- 1.0 packs/day for 48 years    Types: Cigarettes    Quit date: 02/18/2008  . Smokeless tobacco: Never Used  . Alcohol Use: 1.1 oz/week    1 Cans of beer, 1 Drinks containing 0.5 oz of alcohol per week  . Drug Use: No  . Sexually Active: Not on file   Other Topics Concern  . Not on file   Social History Narrative  . No narrative on file   UEA:[id number] positives as outlined above. The remainder of the 18  point review of systems is negative  PHYSICAL EXAM BP 139/88  Pulse 60  Ht 5\' 4"  (1.626 m)  Wt 115 lb (52.164 kg)  BMI 19.74 kg/m2  SpO2 99%  General: Well-developed, well-nourished in no distress Head: Normocephalic and atraumatic Eyes:PERRLA/EOMI intact, conjunctiva and lids normal Ears: No deformity or lesions Mouth: Poor dentition with several missing teeth., normal posterior pharynx Neck: Supple, no JVD.  No masses, thyromegaly or abnormal cervical nodes Lungs: Markedly diminished breath sounds bilaterally.  Normal percussion Cardiac: regular rate and rhythm with normal S1 and S2, no S3 or S4.  PMI is normal.  No pathological murmurs Abdomen: Normal bowel sounds, abdomen is soft and nontender without masses, organomegaly or hernias noted.  No hepatosplenomegaly MSK: Back normal, normal gait muscle strength and tone normal Vascular: Pulse is normal in all 4 extremities Extremities: No peripheral pitting edema Neurologic: Alert and oriented x 3 Skin: Intact without lesions or rashes Lymphatics: No significant adenopathy Psychologic: Normal affect  ECG: Not available  A limited bedside echo was performed today. The patient's ejection fraction remains stable at 40-45% the study was very limited because of his COPD and images were obtained from the subcostal view. He doesn't lateral  hypokinesis there is mild mitral regurgitation mild tricuspid regurgitation. RV function appears to be normal.  ASSESSMENT AND PLAN

## 2010-09-20 NOTE — Assessment & Plan Note (Signed)
Blood pressure well controlled we'll continue current medical regimen.

## 2010-09-27 ENCOUNTER — Telehealth: Payer: Self-pay | Admitting: *Deleted

## 2010-09-27 MED ORDER — BISOPROLOL FUMARATE 5 MG PO TABS: ORAL_TABLET | ORAL | Status: DC

## 2010-09-27 NOTE — Telephone Encounter (Signed)
Message left on voicemail that he needed refill on Bisoprolol 5mg . [phone] - left message for return call.  Call placed to other number on contact list.  Spoke with his son.  States he uses [facility].  Will go ahead & send in refill for this med.  Asked him to have patient call back on Monday if have any further questions.   Son verbalized understanding.

## 2010-10-21 ENCOUNTER — Other Ambulatory Visit: Payer: Self-pay | Admitting: Pulmonary Disease

## 2010-11-18 LAB — URINALYSIS, MICROSCOPIC ONLY
Bilirubin Urine: NEGATIVE
Glucose, UA: NEGATIVE
Hgb urine dipstick: NEGATIVE
Ketones, ur: NEGATIVE
Leukocytes, UA: NEGATIVE
Nitrite: NEGATIVE
Protein, ur: NEGATIVE
Specific Gravity, Urine: 1.01
Urobilinogen, UA: 0.2
pH: 6.5

## 2010-11-18 LAB — COMPREHENSIVE METABOLIC PANEL
Albumin: 3.8
BUN: 11
Calcium: 9.2
Chloride: 102
Creatinine, Ser: 1.1
Total Bilirubin: 1
Total Protein: 6.3

## 2010-11-18 LAB — CBC
HCT: 50.3
HCT: 52.1 — ABNORMAL HIGH
Hemoglobin: 16.8
Hemoglobin: 16.9
Hemoglobin: 17.4 — ABNORMAL HIGH
MCHC: 33.5
MCHC: 34.6
MCV: 88.6
MCV: 90.6
Platelets: 192
Platelets: 238
Platelets: 285
RBC: 5.63
RBC: 5.68
RDW: 13.6
RDW: 13.7
RDW: 13.7
WBC: 6.2

## 2010-11-18 LAB — BASIC METABOLIC PANEL
BUN: 28 — ABNORMAL HIGH
Calcium: 8.8
GFR calc non Af Amer: >60
Glucose, Bld: 107 — ABNORMAL HIGH
Sodium: 130 — ABNORMAL LOW

## 2010-11-18 LAB — DIFFERENTIAL
Basophils Absolute: 0
Basophils Absolute: 0
Basophils Relative: 0
Eosinophils Absolute: 0
Eosinophils Relative: 0
Lymphocytes Relative: 13
Lymphocytes Relative: 19
Lymphs Abs: 1.2
Lymphs Abs: 1.5
Monocytes Absolute: 0.9
Monocytes Absolute: 1
Monocytes Relative: 17 — ABNORMAL HIGH
Neutro Abs: 4
Neutro Abs: 8.7 — ABNORMAL HIGH
Neutrophils Relative %: 64

## 2010-11-18 LAB — URINE CULTURE: Colony Count: 6000

## 2010-11-18 LAB — POCT I-STAT, CHEM 8
BUN: 25 — ABNORMAL HIGH
Chloride: 102
Creatinine, Ser: 1.3
Sodium: 135

## 2010-11-18 LAB — APTT: aPTT: 24

## 2010-11-18 LAB — PROTIME-INR: INR: 1

## 2010-11-19 ENCOUNTER — Telehealth: Payer: Self-pay | Admitting: Pulmonary Disease

## 2010-11-19 ENCOUNTER — Other Ambulatory Visit: Payer: Self-pay | Admitting: Pulmonary Disease

## 2010-11-19 NOTE — Telephone Encounter (Signed)
I spoke with [facility] and they state they are filling out paperwork for pt albuterol inhaler and states they need to know how long pt will need albuterol inhaler. I advised her to put lifetime so they will cover his medications. Nothing further was needed

## 2011-01-20 ENCOUNTER — Telehealth: Payer: Self-pay | Admitting: Pulmonary Disease

## 2011-01-20 ENCOUNTER — Other Ambulatory Visit: Payer: Self-pay | Admitting: Pulmonary Disease

## 2011-01-20 MED ORDER — ALBUTEROL SULFATE (2.5 MG/3ML) 0.083% IN NEBU: 2.5000 mg | INHALATION_SOLUTION | Freq: Two times a day (BID) | RESPIRATORY_TRACT | Status: DC

## 2011-01-20 NOTE — Telephone Encounter (Signed)
Ok to refill 

## 2011-01-20 NOTE — Telephone Encounter (Signed)
Contacted pt. Please advise if ok to refill Xanax. Pt aware that Albuterol was sent to his pharmacy. Thanks, Huntley Dec

## 2011-01-21 MED ORDER — ALPRAZOLAM 0.5 MG PO TABS: ORAL_TABLET | ORAL | Status: DC

## 2011-01-21 NOTE — Telephone Encounter (Signed)
Spoke with pt and informed that rx refill for Xanax was called to [facility]  Per Dr Vassie Loll.

## 2011-01-21 NOTE — Telephone Encounter (Signed)
Pt can be reached at [phone].  Pt stated that the Xanax has not been called into [facility] yet.  Pt checking on status & requests to be called.  Aaron [name]

## 2011-02-10 ENCOUNTER — Telehealth: Payer: Self-pay | Admitting: Allergy

## 2011-02-10 ENCOUNTER — Other Ambulatory Visit: Payer: Self-pay | Admitting: Allergy

## 2011-02-10 MED ORDER — ALPRAZOLAM 0.5 MG PO TABS: ORAL_TABLET | ORAL | Status: DC

## 2011-02-10 MED ORDER — ALBUTEROL SULFATE (2.5 MG/3ML) 0.083% IN NEBU: 2.5000 mg | INHALATION_SOLUTION | Freq: Two times a day (BID) | RESPIRATORY_TRACT | Status: DC

## 2011-02-10 NOTE — Telephone Encounter (Signed)
Ok with me 

## 2011-02-10 NOTE — Telephone Encounter (Signed)
[facility]  requesting alprazolam 0.5 mg  Takes 1 tablet by mouth q12 prn #60  last fill was 12/20/2010  No Known Allergies Dr Sherene Sires is this ok to fill thanks

## 2011-03-17 ENCOUNTER — Other Ambulatory Visit: Payer: Self-pay | Admitting: Cardiology

## 2011-03-17 ENCOUNTER — Ambulatory Visit (INDEPENDENT_AMBULATORY_CARE_PROVIDER_SITE_OTHER): Payer: Medicare Other | Admitting: Pulmonary Disease

## 2011-03-17 ENCOUNTER — Encounter: Payer: Self-pay | Admitting: Pulmonary Disease

## 2011-03-17 VITALS — BP 120/80 | HR 68 | Temp 98.0°F | Ht 64.0 in | Wt 115.6 lb

## 2011-03-17 DIAGNOSIS — I714 Abdominal aortic aneurysm, without rupture: Secondary | ICD-10-CM

## 2011-03-17 DIAGNOSIS — J449 Chronic obstructive pulmonary disease, unspecified: Secondary | ICD-10-CM

## 2011-03-17 DIAGNOSIS — J984 Other disorders of lung: Secondary | ICD-10-CM

## 2011-03-17 NOTE — Assessment & Plan Note (Signed)
Stable, Ct advair & spiriva, no bspasm, does need SABA prn Did not tolerate daliresp Stay on xanax for anxiety - have encouraged him to obtain PCP

## 2011-03-17 NOTE — Assessment & Plan Note (Signed)
Will rpt surveillance CT in 4 mnths - if stable can stop further imaging

## 2011-03-17 NOTE — Patient Instructions (Signed)
CT scan in 4 months  Call if you are using nebs more than 3 times daily

## 2011-03-17 NOTE — Progress Notes (Signed)
  Subjective:    Patient ID: Aaron [name], male    DOB: 03/16/1950, 61 y.o.   MRN: [id number]  HPI 60/M, ex- smoker, quit '10 for FU of pulm nodules & COPD Gold stg 3 -FEV1 31%  First noted in bilat upper lobes in 6/09, but new nodules keep appearing resolving on serial CTs. On last CT in march'12 appear to be inflammatory & scarring.  On xanax for anxiety.   March 16, 2009 - -changed lisinopril to benicar.   09/16/2010 -daliresp caused diarrhea  Started Pulm rehab x 2 weeks - then caught a cold.    Pulmonary History:  1. CXR at [facility] showed RUL nodule >> CT 6/09 showed multiple nodules as descibed -of concern 7mm RUL with sub pleural scarring & 8mm LUL. >> stable on CT 3/10 but new nodules Lt lung  7/10 >> Stable right upper lobe and left upper lobe irregular nodular densities. Previously noted left lower lobe density resolved.  1/11 CT chest>> stable rt nodules, new LUL 3.1 cm patchy density, 8mm new lUL subpleural nodule  4/11 1.6 cm LUL spiculation, PET SUV 2.0  3/12 Reviewed CT chest - improved irregular nodules BULs, likely inflammatory.  2. severe COPD FEV1 31% 6/09, DLCO 49%, high TLC c/w emphysema  10/09 COPD exacerbation >> hospitalised - improved remarkably with steroids again  11/09 quit smoking since d/c  Vaccines uptodate   03/17/2011 Did not continue buspar - back on xanax Using nebs & rescue inhaler bid -Feels more dyspneic, may be 'allergies', denies cough, occasional wheeze , Using albuterol nebs & mDI 3-4 /day     Review of Systems Patient denies significant cough, hemoptysis,  chest pain, palpitations, pedal edema, orthopnea, paroxysmal nocturnal dyspnea, lightheadedness, nausea, vomiting, abdominal or  leg pains      Objective:   Physical Exam  Gen. Pleasant, well-nourished, in no distress ENT - no lesions, no post nasal drip Neck: No JVD, no thyromegaly, no carotid bruits Lungs: no use of accessory muscles, no dullness to percussion, clear  without rales or rhonchi  Cardiovascular: Rhythm regular, heart sounds  normal, no murmurs or gallops, no peripheral edema Musculoskeletal: No deformities, no cyanosis or clubbing        Assessment & Plan:

## 2011-03-31 ENCOUNTER — Ambulatory Visit: Payer: Medicare Other | Admitting: Cardiology

## 2011-04-10 ENCOUNTER — Encounter: Payer: Self-pay | Admitting: Physician Assistant

## 2011-04-11 ENCOUNTER — Telehealth: Payer: Self-pay | Admitting: Pulmonary Disease

## 2011-04-11 ENCOUNTER — Encounter: Payer: Self-pay | Admitting: Physician Assistant

## 2011-04-11 ENCOUNTER — Ambulatory Visit (INDEPENDENT_AMBULATORY_CARE_PROVIDER_SITE_OTHER): Payer: Medicare Other | Admitting: Physician Assistant

## 2011-04-11 DIAGNOSIS — I2510 Atherosclerotic heart disease of native coronary artery without angina pectoris: Secondary | ICD-10-CM

## 2011-04-11 DIAGNOSIS — I714 Abdominal aortic aneurysm, without rupture, unspecified: Secondary | ICD-10-CM

## 2011-04-11 DIAGNOSIS — E785 Hyperlipidemia, unspecified: Secondary | ICD-10-CM

## 2011-04-11 DIAGNOSIS — I10 Essential (primary) hypertension: Secondary | ICD-10-CM

## 2011-04-11 MED ORDER — ATORVASTATIN CALCIUM 20 MG PO TABS: 20.0000 mg | ORAL_TABLET | Freq: Every day | ORAL | Status: DC

## 2011-04-11 MED ORDER — BISOPROLOL FUMARATE 5 MG PO TABS: 5.0000 mg | ORAL_TABLET | Freq: Every day | ORAL | Status: DC

## 2011-04-11 MED ORDER — ALBUTEROL SULFATE HFA 108 (90 BASE) MCG/ACT IN AERS: 2.0000 | INHALATION_SPRAY | Freq: Four times a day (QID) | RESPIRATORY_TRACT | Status: DC | PRN

## 2011-04-11 NOTE — Assessment & Plan Note (Signed)
Quiescent on current medication regimen. Continue monitoring closely. No current indication for ischemic reevaluation.

## 2011-04-11 NOTE — Assessment & Plan Note (Signed)
Will increase bisoprolol to 5 mg daily. Continue Benicar at current low-dose.

## 2011-04-11 NOTE — Progress Notes (Signed)
HPI: Patient presents for scheduled followup, last seen in clinic August 2012, by Dr. Andee Lineman. No medication adjustments recommended. Lipid profile ordered, revealing LDL 110 (down from 135 in remote past). Patient unable to tolerate statins.  He denies any interim development of exertional CP. He does have COPD, which limits his exercise tolerance. He does use inhalers. He has not had to use any NTG, since last seen. He quit smoking tobacco, after his MI.  No Known Allergies  Current Outpatient Prescriptions  Medication Sig Dispense Refill  . ADVAIR DISKUS 250-50 MCG/DOSE AEPB INHALE 1 PUFF INTO THE LUNGS EVERY 12 (TWELVE) HOURS.  60 each  5  . albuterol (PROVENTIL) (2.5 MG/3ML) 0.083% nebulizer solution Take 3 mLs (2.5 mg total) by nebulization 2 (two) times daily. 1 vial two times daily in nebulizer  75 mL  4  . ALPRAZolam (XANAX) 0.5 MG tablet Take one every 12 hours as needed   60 tablet  5  . aspirin 325 MG tablet Take 325 mg by mouth daily.        Marland Kitchen BENICAR 20 MG tablet TAKE 1/2 TABLET BY MOUTH DAILY  15 tablet  5  . bisoprolol (ZEBETA) 5 MG tablet 1/2 tablet daily  15 tablet  6  . clopidogrel (PLAVIX) 75 MG tablet Take 1 tablet (75 mg total) by mouth daily.  30 tablet  6  . nitroGLYCERIN (NITROSTAT) 0.4 MG SL tablet Place 0.4 mg under the tongue every 5 (five) minutes as needed.        . Red Yeast Rice 600 MG CAPS Take by mouth. Once daily       . SPIRIVA HANDIHALER 18 MCG inhalation capsule PLACE 1 CAPSULE (18 MCG TOTAL) INTO INHALER AND INHALE DAILY.  30 capsule  5  . VENTOLIN HFA 108 (90 BASE) MCG/ACT inhaler INHALE 2 PUFFS INTO THE LUNGS EVERY 6 (SIX) HOURS AS NEEDED.  18 each  3    Past Medical History  Diagnosis Date  . COPD (chronic obstructive pulmonary disease)     severe; bullos emphysema. longstanding tobacco smoking   . Tobacco abuse   . Pulmonary nodule   . History of heart attack   . Hypertension   . Asthma   . PVD (peripheral vascular disease)     distal aneuysm  cicumfeerential ending immed above iliac bifurcation, Lt iliac stenosis of 50%. LLE claudication with 0.90 ABI; 0.94 on rt  . CAD (coronary artery disease)     severe single-vessel coronary arteries w/mildly reduced EF 45% sginif. for her wall motion abnml. chronically occluded rt coronary artery.   . Ischemic cardiomyopathy     severe; EF 40-45% w/inferior apical akenesis by 2D echo 4/09. EF 30-35% by cardiac cath in '04. s/p MSTEMI/bare metal stenting of prximal LAD 10/04. residual, chronic 100% distal RCA stenosis. s/p MI/PCI  in [address]109; HRSA/stenting in 1996 [facility])  . VT (ventricular tachycardia)     hx of nonsustained; noninducible electophysiology study in 2004  . Orthostatic hypotension     hx  . Dyslipidemia     statin intolerant    Past Surgical History  Procedure Date  . Angioplasty 2004    History   Social History  . Marital Status: Divorced    Spouse Name: N/A    Number of Children: N/A  . 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 Smoking status: Former Smoker -- 1.0 packs/day for 48  years    Types: Cigarettes    Quit date: 02/18/2008  . Smokeless tobacco: Never Used  . Alcohol Use: 1.1 oz/week    1 Cans of beer, 1 Drinks containing 0.5 oz of alcohol per week  . Drug Use: No  . Sexually Active: Not on file   Other Topics Concern  . Not on file   Social History Narrative  . No narrative on file    Family History  Problem Relation Age of Onset  . Emphysema Brother   . Lung cancer Father     ROS: no nausea, vomiting; no fever, chills; no melena, hematochezia; no claudication  PHYSICAL EXAM: BP 147/83  Pulse 61  Ht 5\' 4"  (1.626 m)  Wt 112 lb (50.803 kg)  BMI 19.22 kg/m2 GENERAL: 61 year old male, sitting upright; NAD HEENT: NCAT, PERRLA, EOMI; sclera clear; no xanthelasma NECK: palpable bilateral carotid pulses, no bruits; no JVD; no TM LUNGS: CTA bilaterally CARDIAC: RRR (S1, S2); no  significant murmurs; no rubs or gallops ABDOMEN: soft, non-tender; intact BS EXTREMETIES: intact distal pulses; no significant peripheral edema SKIN: warm/dry; no obvious rash/lesions MUSCULOSKELETAL: no joint deformity NEURO: no focal deficit; NL affect   EKG:    ASSESSMENT & PLAN:

## 2011-04-11 NOTE — Telephone Encounter (Signed)
Change made in chart refills sent to pharmacy.

## 2011-04-11 NOTE — Assessment & Plan Note (Signed)
Patient has reported intolerance to statins in the past, but not due to myalgia. He is willing to be rechallenged, in order to attain LDL goal. Therefore, will start Lipitor 20 mg daily, and check followup FLP/LFT profile in 12 weeks.

## 2011-04-11 NOTE — Telephone Encounter (Signed)
Pl change from ventolin to Pr-air [facility] -preferred on his formulary Ok to send Rx

## 2011-04-11 NOTE — Assessment & Plan Note (Signed)
Previously assessed as severe, followed by pulmonary

## 2011-04-11 NOTE — Assessment & Plan Note (Signed)
We'll reschedule surveillance abdominal ultrasound, in followup of previously documented AAA (3.2 x 3.3 cm)

## 2011-04-11 NOTE — Patient Instructions (Signed)
   Lipitor 20mg  daily Your physician recommends that you go to the [facility] for lab work in 12 weeks for fasting lipid & liver panel.  Will send reminder in mail. Abdominal ultrasound Increase Bisoprolol to 5mg  daily If the results of your test are normal or stable, you will receive a letter.  If they are abnormal, the nurse will contact you by phone. Your physician wants you to follow up in: 6 months.  You will receive a reminder letter in the mail one-two months in advance.  If you don't receive a letter, please call our office to schedule the follow up appointment

## 2011-04-18 ENCOUNTER — Other Ambulatory Visit: Payer: Self-pay | Admitting: *Deleted

## 2011-04-18 MED ORDER — CLOPIDOGREL BISULFATE 75 MG PO TABS: 75.0000 mg | ORAL_TABLET | Freq: Every day | ORAL | Status: DC

## 2011-04-23 ENCOUNTER — Other Ambulatory Visit: Payer: Self-pay | Admitting: Pulmonary Disease

## 2011-04-23 MED ORDER — OLMESARTAN MEDOXOMIL 20 MG PO TABS: ORAL_TABLET | ORAL | Status: DC

## 2011-05-20 ENCOUNTER — Other Ambulatory Visit: Payer: Self-pay | Admitting: Pulmonary Disease

## 2011-05-22 ENCOUNTER — Encounter: Payer: Self-pay | Admitting: Cardiology

## 2011-05-23 ENCOUNTER — Other Ambulatory Visit: Payer: Self-pay | Admitting: *Deleted

## 2011-05-23 MED ORDER — TIOTROPIUM BROMIDE MONOHYDRATE 18 MCG IN CAPS: 18.0000 ug | ORAL_CAPSULE | Freq: Every day | RESPIRATORY_TRACT | Status: DC

## 2011-06-03 ENCOUNTER — Telehealth: Payer: Self-pay | Admitting: Pulmonary Disease

## 2011-06-03 MED ORDER — AZITHROMYCIN 250 MG PO TABS: ORAL_TABLET | ORAL | Status: AC

## 2011-06-03 MED ORDER — PREDNISONE 10 MG PO TABS: ORAL_TABLET | ORAL | Status: DC

## 2011-06-03 NOTE — Telephone Encounter (Signed)
z-pak Pred 10 -Take 4 tabs  daily with food x 4 days, then 3 tabs daily x 4 days, then 2 tabs daily x 4 days, then 1 tab daily x4 days then stop. #40

## 2011-06-03 NOTE — Telephone Encounter (Signed)
Called, spoke with pt.  Informed Dr. Gretchen Short he take a z pak and a prednisone taper - Take 4 tabs daily with food x 4 days, then 3 tabs daily x 4 days, then 2 tabs daily x 4 days, then 1 tab daily x4 days then stop.  Advised rxs sent to [facility], and he should call back or seek emergency care if sxs do not improve or worsen.  He verbalized understanding of these instructions and voiced no further questions/concerns at this time.

## 2011-06-03 NOTE — Telephone Encounter (Signed)
Called spoke with patient who c/o prod cough with green mucus, wheezing/rattling and increased SOB x2 days.  Denies f/c/s.  Requesting abx and prednisone.  [facility].  NKDA - verified.  Last ov with RA 03-17-11, follow up in 4 months - no pending appts and patient stated that he would like to wait for his CT before scheduling this > scheduled for 5.15.13.  Dr Vassie Loll please advise, thanks.

## 2011-06-30 ENCOUNTER — Telehealth: Payer: Self-pay | Admitting: *Deleted

## 2011-06-30 NOTE — Telephone Encounter (Signed)
Aaron [name] seen in office on 04/11/2011 by Gene. Gene requested patient to have labs in 12 weeks

## 2011-07-02 ENCOUNTER — Ambulatory Visit (INDEPENDENT_AMBULATORY_CARE_PROVIDER_SITE_OTHER)
Admission: RE | Admit: 2011-07-02 | Discharge: 2011-07-02 | Disposition: A | Discharge status: Home / Self Care | Payer: Medicare Other | Source: Ambulatory Visit | Attending: Pulmonary Disease | Admitting: Pulmonary Disease

## 2011-07-02 DIAGNOSIS — J984 Other disorders of lung: Secondary | ICD-10-CM

## 2011-07-04 ENCOUNTER — Telehealth: Payer: Self-pay | Admitting: Pulmonary Disease

## 2011-07-04 NOTE — Telephone Encounter (Signed)
Spoke with patient and informed him that he should go to [facility] next week to have fasting lab work done. Patient verbalized understanding of plan.

## 2011-07-04 NOTE — Telephone Encounter (Signed)
I spoke with patient about results and he verbalized understanding and had no questions 

## 2011-07-07 ENCOUNTER — Ambulatory Visit (INDEPENDENT_AMBULATORY_CARE_PROVIDER_SITE_OTHER): Payer: Medicare Other | Admitting: Pulmonary Disease

## 2011-07-07 ENCOUNTER — Encounter: Payer: Self-pay | Admitting: Pulmonary Disease

## 2011-07-07 VITALS — BP 130/80 | HR 61 | Temp 97.9°F | Ht 64.0 in | Wt 115.4 lb

## 2011-07-07 DIAGNOSIS — J449 Chronic obstructive pulmonary disease, unspecified: Secondary | ICD-10-CM

## 2011-07-07 DIAGNOSIS — J984 Other disorders of lung: Secondary | ICD-10-CM

## 2011-07-07 MED ORDER — ALBUTEROL SULFATE HFA 108 (90 BASE) MCG/ACT IN AERS: 2.0000 | INHALATION_SPRAY | Freq: Four times a day (QID) | RESPIRATORY_TRACT | Status: DC | PRN

## 2011-07-07 MED ORDER — PREDNISONE 10 MG PO TABS: ORAL_TABLET | ORAL | Status: DC

## 2011-07-07 MED ORDER — ROFLUMILAST 500 MCG PO TABS: 500.0000 ug | ORAL_TABLET | Freq: Every day | ORAL | Status: DC

## 2011-07-07 NOTE — Assessment & Plan Note (Signed)
Pred taper Rx sent to pharmacy to start taking if breathing/ wheezing worsens Change to ventolin MDI instead of pro-air Restart daliresp  daily Rpt CT chest in dec

## 2011-07-07 NOTE — Assessment & Plan Note (Signed)
Fu CT in dec '13

## 2011-07-07 NOTE — Patient Instructions (Signed)
Pred taper Rx sent to pharmacy to start taking if breathing/ wheezing worsens Change to ventolin MDI instead of pro-air Restart daliresp  daily Rpt CT chest in dec Appt with internist

## 2011-07-07 NOTE — Progress Notes (Signed)
  Subjective:    Patient ID: MONTAY VANVOORHIS, male    DOB: May 20, 1950, 61 y.o.   MRN: [id number]  HPI  61/M, ex- smoker, quit '10 for FU of pulm nodules & COPD Gold stg 3 -FEV1 31%  First noted in bilat upper lobes in 6/09, but new nodules keep appearing & resolving on serial CTs. On CT in march'12 appear to be inflammatory & scarring.  On xanax for anxiety.  March 16, 2009 - -changed lisinopril to benicar.  09/16/2010 -daliresp caused diarrhea  Started Pulm rehab x 2 weeks - then caught a cold.    severe COPD FEV1 31% 6/09, DLCO 49%, high TLC c/w emphysema   10/09 COPD exacerbation >> hospitalised - improved remarkably with steroids again  11/09 quit smoking since d/c  Vaccines uptodate   03/17/2011  Did not continue buspar - back on xanax  Using nebs & rescue inhaler bid -Feels more dyspneic, may be 'allergies', denies cough, occasional wheeze , Using albuterol nebs & mDI 3-4 /day   07/07/2011 4/13 -zpak + pred taper Patient states a little worse since last visit. c/o sob, wheezing, cough with clear mucus, and chest tightness.  CT chest  - new 6mm nodule in RML, other nodules stable Anxiety well controlled on xanax   Review of Systems Patient denies significant cough, hemoptysis,  chest pain, palpitations, pedal edema, orthopnea, paroxysmal nocturnal dyspnea, lightheadedness, nausea, vomiting, abdominal or  leg pains      Objective:   Physical Exam  Gen. Pleasant, thin woman, in no distress ENT - no lesions, no post nasal drip Neck: No JVD, no thyromegaly, no carotid bruits Lungs: no use of accessory muscles, no dullness to percussion, decreased without rales or rhonchi  Cardiovascular: Rhythm regular, heart sounds  normal, no murmurs or gallops, no peripheral edema Musculoskeletal: No deformities, no cyanosis or clubbing         Assessment & Plan:

## 2011-08-13 ENCOUNTER — Telehealth: Payer: Self-pay | Admitting: Pulmonary Disease

## 2011-08-13 NOTE — Telephone Encounter (Signed)
Spoke with pt and scheduled ov with RA for tomorrow for eval of cough and increased SOB. Advised him to seek emergent care sooner if needed and he verbalized understanding.

## 2011-08-14 ENCOUNTER — Ambulatory Visit: Payer: Medicare Other | Admitting: Pulmonary Disease

## 2011-08-14 ENCOUNTER — Telehealth: Payer: Self-pay | Admitting: Pulmonary Disease

## 2011-08-14 MED ORDER — AZITHROMYCIN 250 MG PO TABS: ORAL_TABLET | ORAL | Status: AC

## 2011-08-14 NOTE — Telephone Encounter (Signed)
Please call pt back at same number

## 2011-08-14 NOTE — Telephone Encounter (Signed)
zpak x 5ds

## 2011-08-14 NOTE — Telephone Encounter (Signed)
[email or web address]  

## 2011-08-14 NOTE — Telephone Encounter (Signed)
Called and spoke with pt and he is aware of the rx for the zpak has been sent to his pharmacy and pt stated that if he does not feel better he will call back for appt.  Nothing further needed.

## 2011-08-14 NOTE — Telephone Encounter (Signed)
Spoke with pt and he is coughing up green sputum and having a lot of congestion and this has been going on for a few days.  Pt is requesting something to be called in today for him.  He is unable to come in for appt today with RA due to money issues.  Pt has an rx of the prednisone and he has almost finished with this.  Pt stated that his phone is messed up and it will not ring but when we call back just leave him a VM.  Please advise.  No Known Allergies

## 2011-08-14 NOTE — Telephone Encounter (Signed)
LMOMTCB x 1 

## 2011-08-19 ENCOUNTER — Other Ambulatory Visit: Payer: Self-pay | Admitting: Pulmonary Disease

## 2011-08-20 ENCOUNTER — Telehealth: Payer: Self-pay | Admitting: Pulmonary Disease

## 2011-08-20 MED ORDER — ALPRAZOLAM 0.5 MG PO TABS: ORAL_TABLET | ORAL | Status: DC

## 2011-08-20 NOTE — Telephone Encounter (Signed)
Rx refill was called to [facility]. Pt aware. Nothing further needed.

## 2011-08-20 NOTE — Telephone Encounter (Signed)
I spoke with pt and he is requesting a refill on his alprazolam 0.5 mg. This was last refilled 02/10/11 #60 x 5 refills 1 q 12 hrs prn. Pt states he is needing this refilled sent in today since he is out and the holiday is tomorrow. Please advise RA thanks

## 2011-08-20 NOTE — Telephone Encounter (Signed)
Ok to send same Rx with refills

## 2011-09-18 ENCOUNTER — Encounter: Payer: Self-pay | Admitting: *Deleted

## 2011-09-18 ENCOUNTER — Other Ambulatory Visit: Payer: Self-pay | Admitting: *Deleted

## 2011-09-18 DIAGNOSIS — E785 Hyperlipidemia, unspecified: Secondary | ICD-10-CM

## 2011-09-18 DIAGNOSIS — I2510 Atherosclerotic heart disease of native coronary artery without angina pectoris: Secondary | ICD-10-CM

## 2011-09-19 ENCOUNTER — Other Ambulatory Visit: Payer: Self-pay | Admitting: Pulmonary Disease

## 2011-09-19 MED ORDER — ALBUTEROL SULFATE (2.5 MG/3ML) 0.083% IN NEBU: INHALATION_SOLUTION | RESPIRATORY_TRACT | Status: DC

## 2011-10-06 ENCOUNTER — Encounter: Payer: Self-pay | Admitting: *Deleted

## 2011-10-06 ENCOUNTER — Ambulatory Visit (INDEPENDENT_AMBULATORY_CARE_PROVIDER_SITE_OTHER): Payer: Medicare Other | Admitting: Physician Assistant

## 2011-10-06 ENCOUNTER — Encounter: Payer: Self-pay | Admitting: Physician Assistant

## 2011-10-06 VITALS — BP 120/79 | HR 58 | Ht 64.0 in | Wt 115.0 lb

## 2011-10-06 DIAGNOSIS — I2510 Atherosclerotic heart disease of native coronary artery without angina pectoris: Secondary | ICD-10-CM

## 2011-10-06 DIAGNOSIS — E782 Mixed hyperlipidemia: Secondary | ICD-10-CM

## 2011-10-06 DIAGNOSIS — I10 Essential (primary) hypertension: Secondary | ICD-10-CM

## 2011-10-06 DIAGNOSIS — I714 Abdominal aortic aneurysm, without rupture: Secondary | ICD-10-CM

## 2011-10-06 DIAGNOSIS — R0609 Other forms of dyspnea: Secondary | ICD-10-CM

## 2011-10-06 DIAGNOSIS — E785 Hyperlipidemia, unspecified: Secondary | ICD-10-CM

## 2011-10-06 MED ORDER — ASPIRIN EC 81 MG PO TBEC: 81.0000 mg | DELAYED_RELEASE_TABLET | Freq: Every day | ORAL | Status: AC

## 2011-10-06 MED ORDER — NITROGLYCERIN 0.4 MG SL SUBL: 0.4000 mg | SUBLINGUAL_TABLET | SUBLINGUAL | Status: DC | PRN

## 2011-10-06 NOTE — Assessment & Plan Note (Signed)
Very well controlled on current regimen

## 2011-10-06 NOTE — Assessment & Plan Note (Signed)
Stable (3.2 cm) by followup ultrasound in April.

## 2011-10-06 NOTE — Assessment & Plan Note (Signed)
Tolerating recent addition of Lipitor, with followup LDL 74. Continue current dose and reassess in 6 months.

## 2011-10-06 NOTE — Patient Instructions (Signed)
   Decrease Aspirin to 81mg  daily  Nitroglycerin refill sent to pharmacy  Dobutamine Stress Myoview  Office will call with results Follow up in  1 month

## 2011-10-06 NOTE — Assessment & Plan Note (Addendum)
We'll order dobutamine stress Myoview for risk stratification. Patient presents with some worsening of baseline DOE, absent of associated CP. However, he has not had a stress test in 4 years. He has known chronic occlusion of the RCA. Beta blocker to be held for 24 hours preprocedure. We will schedule early followup for review of study results and further recommendations.

## 2011-10-06 NOTE — Progress Notes (Signed)
Primary Cardiologist: Lewayne Bunting, MD   HPI: Scheduled six-month followup.  When last seen, I increased Bisoprolol and started Lipitor 20 daily. Patient had reported prior intolerance to statins. Followup FLP yielded LDL 74, improved from previous level of 110.  Patient denies any interim exertional CP. However, he does suggest some slight worsening of DOE. He denies orthopnea, PND, or LE edema. Regarding recent rechallenge with a statin, patient reports no adverse side effects. He cites having had previous left shoulder discomfort, but cannot recall the name or dose of the previous statin.  12 EKG today, reviewed by me, indicates SB 55; nonspecific ST changes  No Known Allergies  Current Outpatient Prescriptions  Medication Sig Dispense Refill  . ADVAIR DISKUS 250-50 MCG/DOSE AEPB INHALE 1 PUFF INTO THE LUNGS EVERY 12 (TWELVE) HOURS.  60 each  5  . albuterol (PROAIR HFA) 108 (90 BASE) MCG/ACT inhaler Inhale 2 puffs into the lungs every 6 (six) hours as needed for wheezing.  1 Inhaler  3  . albuterol (PROVENTIL) (2.5 MG/3ML) 0.083% nebulizer solution 1 vial in nebulizer twice daily  180 mL  2  . ALPRAZolam (XANAX) 0.5 MG tablet Take one every 12 hours as needed  60 tablet  5  . atorvastatin (LIPITOR) 20 MG tablet Take 1 tablet (20 mg total) by mouth daily.  30 tablet  6  . bisoprolol (ZEBETA) 5 MG tablet Take 1 tablet (5 mg total) by mouth daily. 1/2 tablet daily  30 tablet  6  . clopidogrel (PLAVIX) 75 MG tablet Take 1 tablet (75 mg total) by mouth daily.  30 tablet  6  . nitroGLYCERIN (NITROSTAT) 0.4 MG SL tablet Place 1 tablet (0.4 mg total) under the tongue every 5 (five) minutes as needed.  25 tablet  3  . olmesartan (BENICAR) 20 MG tablet 1/2 tablet daily  15 tablet  3  . tiotropium (SPIRIVA HANDIHALER) 18 MCG inhalation capsule Place 1 capsule (18 mcg total) into inhaler and inhale daily.  30 capsule  5  . DISCONTD: nitroGLYCERIN (NITROSTAT) 0.4 MG SL tablet Place 0.4 mg under the  tongue every 5 (five) minutes as needed.        Marland Kitchen aspirin EC 81 MG tablet Take 1 tablet (81 mg total) by mouth daily.      Marland Kitchen DISCONTD: albuterol (VENTOLIN HFA) 108 (90 BASE) MCG/ACT inhaler Inhale 2 puffs into the lungs every 6 (six) hours as needed for wheezing.  1 Inhaler  4    Past Medical History  Diagnosis Date  . COPD (chronic obstructive pulmonary disease)     severe; bullos emphysema. longstanding tobacco smoking   . Tobacco abuse   . Pulmonary nodule   . History of heart attack   . Hypertension   . Asthma   . PVD (peripheral vascular disease)     distal aneuysm cicumfeerential ending immed above iliac bifurcation, Lt iliac stenosis of 50%. LLE claudication with 0.90 ABI; 0.94 on rt  . CAD (coronary artery disease)     severe single-vessel coronary arteries w/mildly reduced EF 45% sginif. for her wall motion abnml. chronically occluded rt coronary artery.   . Ischemic cardiomyopathy     severe; EF 40-45% w/inferior apical akenesis by 2D echo 4/09. EF 30-35% by cardiac cath in '04. s/p MSTEMI/bare metal stenting of prximal LAD 10/04. residual, chronic 100% distal RCA stenosis. s/p MI/PCI  in [address]; HRSA/stenting in 1996 [facility])  . VT (ventricular tachycardia)     hx of nonsustained; noninducible electophysiology study in  2004  . Orthostatic hypotension     hx  . Dyslipidemia     statin intolerant    Past Surgical History  Procedure Date  . Angioplasty 2004    History   Social History  . Marital Status: Divorced    Spouse Name: N/A    Number of Children: N/A  . 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 Smoking status: Former Smoker -- 1.0 packs/day for 48 years    Types: Cigarettes    Quit date: 02/18/2008  . Smokeless tobacco: Never Used  . Alcohol Use: 1.1 oz/week    1 Cans of beer, 1 Drinks containing 0.5 oz of alcohol per week  . Drug Use: No  . Sexually Active: Not on file   Other Topics  Concern  . Not on file   Social History Narrative  . No narrative on file   Social History Narrative  . No narrative on file    Problem Relation Age of Onset  . Emphysema Brother   . Lung cancer Father     ROS: no nausea, vomiting; no fever, chills; no melena, hematochezia; no claudication  PHYSICAL EXAM: BP 120/79  Pulse 58  Ht 5\' 4"  (1.626 m)  Wt 115 lb (52.164 kg)  BMI 19.74 kg/m2  SpO2 100% GENERAL: 61 year old male, sitting upright; NAD  HEENT: NCAT, PERRLA, EOMI; sclera clear; no xanthelasma  NECK: palpable bilateral carotid pulses, no bruits; no JVD; no TM  LUNGS: CTA bilaterally  CARDIAC: RRR (S1, S2); no significant murmurs; no rubs or gallops  ABDOMEN: soft, non-tender; intact BS  EXTREMETIES: intact distal pulses; no significant peripheral edema  SKIN: warm/dry; no obvious rash/lesions  MUSCULOSKELETAL: no joint deformity  NEURO: no focal deficit; NL affect    EKG: reviewed and available in Electronic Records   ASSESSMENT & PLAN:  CORONARY ATHEROSCLEROSIS NATIVE CORONARY ARTERY We'll order dobutamine stress Myoview for risk stratification. Patient presents with some worsening of baseline DOE, absent of associated CP. However, he has not had a stress test in 4 years. He has known chronic occlusion of the RCA. Beta blocker to be held for 24 hours preprocedure. We will schedule early followup for review of study results and further recommendations.  Dyslipidemia Tolerating recent addition of Lipitor, with followup LDL 74. Continue current dose and reassess in 6 months.  HYPERTENSION Very well controlled on current regimen  AAA Stable (3.2 cm) by followup ultrasound in April.    [facility] Etherine Mackowiak, PAC

## 2011-10-09 ENCOUNTER — Other Ambulatory Visit: Payer: Self-pay | Admitting: Physician Assistant

## 2011-10-09 DIAGNOSIS — I2510 Atherosclerotic heart disease of native coronary artery without angina pectoris: Secondary | ICD-10-CM

## 2011-10-13 DIAGNOSIS — I2510 Atherosclerotic heart disease of native coronary artery without angina pectoris: Secondary | ICD-10-CM

## 2011-11-03 ENCOUNTER — Telehealth: Payer: Self-pay | Admitting: Adult Health

## 2011-11-03 ENCOUNTER — Encounter: Payer: Self-pay | Admitting: Adult Health

## 2011-11-03 ENCOUNTER — Ambulatory Visit (INDEPENDENT_AMBULATORY_CARE_PROVIDER_SITE_OTHER): Payer: Medicare Other | Admitting: Adult Health

## 2011-11-03 VITALS — BP 124/72 | HR 64 | Temp 97.2°F | Ht 64.0 in | Wt 117.2 lb

## 2011-11-03 DIAGNOSIS — J984 Other disorders of lung: Secondary | ICD-10-CM

## 2011-11-03 DIAGNOSIS — J449 Chronic obstructive pulmonary disease, unspecified: Secondary | ICD-10-CM

## 2011-11-03 DIAGNOSIS — Z23 Encounter for immunization: Secondary | ICD-10-CM

## 2011-11-03 MED ORDER — ALBUTEROL SULFATE (2.5 MG/3ML) 0.083% IN NEBU: INHALATION_SOLUTION | RESPIRATORY_TRACT | Status: DC

## 2011-11-03 NOTE — Telephone Encounter (Signed)
Will forward to [place] as requested.

## 2011-11-03 NOTE — Addendum Note (Signed)
Addended by: Boone Master E on: 11/03/2011 01:32 PM   Modules accepted: Orders

## 2011-11-03 NOTE — Telephone Encounter (Signed)
Called spoke with patient, verified that he is taking his albuterol neb twice daily.  Pt requests this be 90 day supply.  Rx quantity changed and sent to verified pharmacy - pt is aware.

## 2011-11-03 NOTE — Progress Notes (Addendum)
60/M, ex- smoker, quit '10 for FU of pulm nodules & COPD Gold stg 3 -FEV1 31%  First noted in bilat upper lobes in 6/09, but new nodules keep appearing & resolving on serial CTs. On CT in march'12 appear to be inflammatory & scarring.  On xanax for anxiety.  March 16, 2009 - -changed lisinopril to benicar.  09/16/2010 -daliresp caused diarrhea  Started Pulm rehab x 2 weeks - then caught a cold.    severe COPD FEV1 31% 6/09, DLCO 49%, high TLC c/w emphysema   10/09 COPD exacerbation >> hospitalised - improved remarkably with steroids again  11/09 quit smoking since d/c  Vaccines uptodate   03/17/2011  Did not continue buspar - back on xanax  Using nebs & rescue inhaler bid -Feels more dyspneic, may be 'allergies', denies cough, occasional wheeze , Using albuterol nebs & mDI 3-4 /day   07/07/11  4/13 -zpak + pred taper Patient states a little worse since last visit. c/o sob, wheezing, cough with clear mucus, and chest tightness.  CT chest  - new 6mm nodule in RML, other nodules stable Anxiety well controlled on xanax >>steroid taper, restart Daliresp, rpt CT in Dec  11/03/2011 Follow up  4 month follow up COPD - reports breathing is doing well, no new complaints. No ER /admission for breathing issues.  No increased SABA use.  Has CT chest set up for 12/16 to follow up nodule.  No weight loss , hemoptysis or edema.  Stopped Daliresp, could not tolerate or afford   Review of Systems Constitutional:   No  weight loss, night sweats,  Fevers, chills, fatigue, or  lassitude.  HEENT:   No headaches,  Difficulty swallowing,  Tooth/dental problems, or  Sore throat,                No sneezing, itching, ear ache, nasal congestion, post nasal drip,   CV:  No chest pain,  Orthopnea, PND, swelling in lower extremities, anasarca, dizziness, palpitations, syncope.   GI  No heartburn, indigestion, abdominal pain, nausea, vomiting, diarrhea, change in bowel habits, loss of appetite, bloody stools.     Resp:  No excess mucus, no productive cough,  No non-productive cough,  No coughing up of blood.  No change in color of mucus.  No wheezing.  No chest wall deformity  Skin: no rash or lesions.  GU: no dysuria, change in color of urine, no urgency or frequency.  No flank pain, no hematuria   MS:  No joint pain or swelling.  No decreased range of motion.  No back pain.  Psych:  No change in mood or affect. No depression or anxiety.  No memory loss.         Objective:   Physical Exam   Gen. Pleasant, thin male , in no distress ENT - no lesions, no post nasal drip, poor dentition  Neck: No JVD, no thyromegaly, no carotid bruits Lungs: no use of accessory muscles, no dullness to percussion, decreased without rales or rhonchi  Cardiovascular: Rhythm regular, heart sounds  normal, no murmurs or gallops, no peripheral edema Musculoskeletal: No deformities, no cyanosis or clubbing         Assessment & Plan:

## 2011-11-03 NOTE — Assessment & Plan Note (Signed)
Compensated on present regimen  Flu shot today  

## 2011-11-03 NOTE — Progress Notes (Deleted)
  Subjective:    Patient ID: Aaron [name], male    DOB: 1951/02/18, 61 y.o.   MRN: [id number]  HPI   60/M, ex- smoker, quit '10 for FU of pulm nodules & COPD Gold stg 3 -FEV1 31%  First noted in bilat upper lobes in 6/09, but new nodules keep appearing & resolving on serial CTs. On CT in march'12 appear to be inflammatory & scarring.  On xanax for anxiety.  March 16, 2009 - -changed lisinopril to benicar.  09/16/2010 -daliresp caused diarrhea  Started Pulm rehab x 2 weeks - then caught a cold.    severe COPD FEV1 31% 6/09, DLCO 49%, high TLC c/w emphysema   10/09 COPD exacerbation >> hospitalised - improved remarkably with steroids again  11/09 quit smoking since d/c  Vaccines uptodate   03/17/2011  Did not continue buspar - back on xanax  Using nebs & rescue inhaler bid -Feels more dyspneic, may be 'allergies', denies cough, occasional wheeze , Using albuterol nebs & mDI 3-4 /day   07/07/11  4/13 -zpak + pred taper Patient states a little worse since last visit. c/o sob, wheezing, cough with clear mucus, and chest tightness.  CT chest  - new 6mm nodule in RML, other nodules stable Anxiety well controlled on xanax >>steroid taper, restart Daliresp, rpt CT in Dec  11/03/2011 Follow up   Review of Systems  Constitutional:   No  weight loss, night sweats,  Fevers, chills, fatigue, or  lassitude.  HEENT:   No headaches,  Difficulty swallowing,  Tooth/dental problems, or  Sore throat,                No sneezing, itching, ear ache, nasal congestion, post nasal drip,   CV:  No chest pain,  Orthopnea, PND, swelling in lower extremities, anasarca, dizziness, palpitations, syncope.   GI  No heartburn, indigestion, abdominal pain, nausea, vomiting, diarrhea, change in bowel habits, loss of appetite, bloody stools.   Resp: No shortness of breath with exertion or at rest.  No excess mucus, no productive cough,  No non-productive cough,  No coughing up of blood.  No change in color of  mucus.  No wheezing.  No chest wall deformity  Skin: no rash or lesions.  GU: no dysuria, change in color of urine, no urgency or frequency.  No flank pain, no hematuria   MS:  No joint pain or swelling.  No decreased range of motion.  No back pain.  Psych:  No change in mood or affect. No depression or anxiety.  No memory loss.         Objective:   Physical Exam   Gen. Pleasant, thin woman, in no distress ENT - no lesions, no post nasal drip Neck: No JVD, no thyromegaly, no carotid bruits Lungs: no use of accessory muscles, no dullness to percussion, decreased without rales or rhonchi  Cardiovascular: Rhythm regular, heart sounds  normal, no murmurs or gallops, no peripheral edema Musculoskeletal: No deformities, no cyanosis or clubbing         Assessment & Plan:

## 2011-11-03 NOTE — Patient Instructions (Addendum)
Continue on Advair and Spiriva  Flu shot today  follow up for CT scan in Dec as planned  follow up Dr. Vassie Loll  In 3 months -after CT

## 2011-11-03 NOTE — Assessment & Plan Note (Signed)
Repeat CT in Dec this year  Already set up on 02/02/12-pt aware  Ov after CT to discuss

## 2011-11-06 ENCOUNTER — Encounter: Payer: Self-pay | Admitting: Physician Assistant

## 2011-11-06 ENCOUNTER — Ambulatory Visit (INDEPENDENT_AMBULATORY_CARE_PROVIDER_SITE_OTHER): Payer: Medicare Other | Admitting: Physician Assistant

## 2011-11-06 VITALS — BP 110/79 | HR 60 | Ht 65.0 in | Wt 118.0 lb

## 2011-11-06 DIAGNOSIS — I10 Essential (primary) hypertension: Secondary | ICD-10-CM

## 2011-11-06 DIAGNOSIS — J984 Other disorders of lung: Secondary | ICD-10-CM

## 2011-11-06 DIAGNOSIS — I2510 Atherosclerotic heart disease of native coronary artery without angina pectoris: Secondary | ICD-10-CM

## 2011-11-06 DIAGNOSIS — E785 Hyperlipidemia, unspecified: Secondary | ICD-10-CM

## 2011-11-06 NOTE — Assessment & Plan Note (Signed)
No further workup indicated. Recent dobutamine stress Myoview showed no definite ischemia, with probable inferior wall scar; EF 59%. Patient has known 100% RCA occlusion by prior studies. He continues to report no exertional CP. We'll reassess clinical status in 4 months, at which time he will establish here in our [facility] clinic with Dr. Kirke Corin.

## 2011-11-06 NOTE — Patient Instructions (Signed)
Continue all current medications. Follow up in  4 months  

## 2011-11-06 NOTE — Assessment & Plan Note (Signed)
Followed by [facility] in [place]

## 2011-11-06 NOTE — Assessment & Plan Note (Signed)
Well-controlled on current medication regimen 

## 2011-11-06 NOTE — Assessment & Plan Note (Addendum)
Continue current dose Lipitor, with most recent LDL of 74

## 2011-11-06 NOTE — Progress Notes (Signed)
Primary Cardiologist: Lewayne Bunting, MD   HPI: Patient presents for scheduled early followup, status post outpatient ischemic evaluation with a dobutamine stress Myoview. When last seen in office, patient presented to me with complaint of worsening DOE, but with no associated CP. He had not had a stress test in 4 years.   - Dobutamine stress Myoview: No definite ischemia; EF 59%  Clinically, he denies any interim development of exertional CP. He continues to have chronic DOE, with no recent exacerbation. He recently was seen in followup by his pulmonologist in [place], for his known severe COPD/emphysema, as well as documented pulmonary nodules. He has not smoked tobacco since 2010.  No Known Allergies  Current Outpatient Prescriptions  Medication Sig Dispense Refill  . ADVAIR DISKUS 250-50 MCG/DOSE AEPB INHALE 1 PUFF INTO THE LUNGS EVERY 12 (TWELVE) HOURS.  60 each  5  . albuterol (PROAIR HFA) 108 (90 BASE) MCG/ACT inhaler Inhale 2 puffs into the lungs every 6 (six) hours as needed for wheezing.  1 Inhaler  3  . albuterol (PROVENTIL) (2.5 MG/3ML) 0.083% nebulizer solution 1 vial in nebulizer twice daily  180 vial  3  . ALPRAZolam (XANAX) 0.5 MG tablet Take one every 12 hours as needed  60 tablet  5  . aspirin EC 81 MG tablet Take 1 tablet (81 mg total) by mouth daily.      Marland Kitchen atorvastatin (LIPITOR) 20 MG tablet Take 1 tablet (20 mg total) by mouth daily.  30 tablet  6  . bisoprolol (ZEBETA) 5 MG tablet Take 5 mg by mouth daily.      . clopidogrel (PLAVIX) 75 MG tablet Take 1 tablet (75 mg total) by mouth daily.  30 tablet  6  . nitroGLYCERIN (NITROSTAT) 0.4 MG SL tablet Place 1 tablet (0.4 mg total) under the tongue every 5 (five) minutes as needed.  25 tablet  3  . olmesartan (BENICAR) 20 MG tablet 1/2 tablet daily  15 tablet  3  . tiotropium (SPIRIVA HANDIHALER) 18 MCG inhalation capsule Place 1 capsule (18 mcg total) into inhaler and inhale daily.  30 capsule  5  . DISCONTD: bisoprolol (ZEBETA) 5  MG tablet Take 1 tablet (5 mg total) by mouth daily. 1/2 tablet daily  30 tablet  6    Past Medical History  Diagnosis Date  . COPD (chronic obstructive pulmonary disease)     severe; bullos emphysema. longstanding tobacco smoking   . Tobacco abuse   . Pulmonary nodule   . History of heart attack   . Hypertension   . Asthma   . PVD (peripheral vascular disease)     distal aneuysm cicumfeerential ending immed above iliac bifurcation, Lt iliac stenosis of 50%. LLE claudication with 0.90 ABI; 0.94 on rt  . CAD (coronary artery disease)     severe single-vessel coronary arteries w/mildly reduced EF 45% sginif. for her wall motion abnml. chronically occluded rt coronary artery.   . Ischemic cardiomyopathy     severe; EF 40-45% w/inferior apical akenesis by 2D echo 4/09. EF 30-35% by cardiac cath in '04. s/p MSTEMI/bare metal stenting of prximal LAD 10/04. residual, chronic 100% distal RCA stenosis. s/p MI/PCI  in [address]; HRSA/stenting in 1996 [facility])  . VT (ventricular tachycardia)     hx of nonsustained; noninducible electophysiology study in 2004  . Orthostatic hypotension     hx  . Dyslipidemia     statin intolerant    Past Surgical History  Procedure Date  . Angioplasty 2004  History   Social History  . Marital Status: Divorced    Spouse Name: N/A    Number of Children: N/A  . 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 Smoking status: Former Smoker -- 1.0 packs/day for 48 years    Types: Cigarettes    Quit date: 02/18/2008  . Smokeless tobacco: Never Used  . Alcohol Use: 1.1 oz/week    1 Cans of beer, 1 Drinks containing 0.5 oz of alcohol per week  . Drug Use: No  . Sexually Active: Not on file   Other Topics Concern  . Not on file   Social History Narrative  . No narrative on file    Family History  Problem Relation Age of Onset  . Emphysema Brother   . Lung cancer Father     ROS: no nausea,  vomiting; no fever, chills; no melena, hematochezia; no claudication  PHYSICAL EXAM: BP 110/79  Pulse 60  Ht 5\' 5"  (1.651 m)  Wt 118 lb (53.524 kg)  BMI 19.64 kg/m2 GENERAL: 61 year old male, sitting upright; NAD  HEENT: NCAT, PERRLA, EOMI; sclera clear; no xanthelasma  NECK: palpable bilateral carotid pulses, no bruits; no JVD; no TM  LUNGS: Diminished breath sounds CARDIAC: RRR (S1, S2); no significant murmurs; no rubs or gallops  ABDOMEN: soft, non-tender; intact BS  EXTREMETIES: no significant peripheral edema  SKIN: warm/dry; no obvious rash/lesions  MUSCULOSKELETAL: no joint deformity  NEURO: no focal deficit; NL affect    EKG:    ASSESSMENT & PLAN:    Aaron [name], PAC

## 2011-12-02 ENCOUNTER — Telehealth: Payer: Self-pay | Admitting: Pulmonary Disease

## 2011-12-02 MED ORDER — DOXYCYCLINE HYCLATE 100 MG PO TABS: 100.0000 mg | ORAL_TABLET | Freq: Every day | ORAL | Status: DC

## 2011-12-02 MED ORDER — PREDNISONE 10 MG PO TABS: ORAL_TABLET | ORAL | Status: DC

## 2011-12-02 NOTE — Telephone Encounter (Signed)
Spoke with pt and notified of recs per RA Pt verbalized understanding Rxs were sent to pharm

## 2011-12-02 NOTE — Telephone Encounter (Signed)
pred 20 mg daily x 7ds, then 10 mg x 7ds If green phlegm persists, doxy 100 daily x 7ds

## 2011-12-02 NOTE — Telephone Encounter (Signed)
Spoke with pt. He is c/o increased SOB and cough for the past 2 wks, worse the past couple of days. Cough is prod with minimal green to clear sputum. He states very hard to produce sputum. Denies any f/c/s, CP, chest tightness or other complaints. Declined appt, and would like something to be called in. RA, please advise, thanks! No Known Allergies

## 2011-12-18 ENCOUNTER — Other Ambulatory Visit: Payer: Self-pay

## 2011-12-18 MED ORDER — ATORVASTATIN CALCIUM 20 MG PO TABS: 20.0000 mg | ORAL_TABLET | Freq: Every day | ORAL | Status: DC

## 2011-12-18 NOTE — Telephone Encounter (Signed)
..   Requested Prescriptions   Pending Prescriptions Disp Refills  . atorvastatin (LIPITOR) 20 MG tablet 90 tablet 2    Sig: Take 1 tablet (20 mg total) by mouth daily.

## 2011-12-19 ENCOUNTER — Other Ambulatory Visit: Payer: Self-pay | Admitting: Physician Assistant

## 2012-01-20 ENCOUNTER — Other Ambulatory Visit: Payer: Self-pay | Admitting: Cardiology

## 2012-01-20 ENCOUNTER — Other Ambulatory Visit: Payer: Self-pay | Admitting: Pulmonary Disease

## 2012-02-02 ENCOUNTER — Ambulatory Visit (INDEPENDENT_AMBULATORY_CARE_PROVIDER_SITE_OTHER)
Admission: RE | Admit: 2012-02-02 | Discharge: 2012-02-02 | Disposition: A | Discharge status: Home / Self Care | Payer: Medicare Other | Source: Ambulatory Visit | Attending: Pulmonary Disease | Admitting: Pulmonary Disease

## 2012-02-02 DIAGNOSIS — J984 Other disorders of lung: Secondary | ICD-10-CM

## 2012-02-16 ENCOUNTER — Ambulatory Visit (INDEPENDENT_AMBULATORY_CARE_PROVIDER_SITE_OTHER): Payer: Medicare Other | Admitting: Pulmonary Disease

## 2012-02-16 ENCOUNTER — Other Ambulatory Visit: Payer: Self-pay | Admitting: Pulmonary Disease

## 2012-02-16 ENCOUNTER — Encounter: Payer: Self-pay | Admitting: Pulmonary Disease

## 2012-02-16 VITALS — BP 110/70 | HR 65 | Temp 97.9°F | Ht 64.0 in | Wt 114.8 lb

## 2012-02-16 DIAGNOSIS — J449 Chronic obstructive pulmonary disease, unspecified: Secondary | ICD-10-CM

## 2012-02-16 DIAGNOSIS — J984 Other disorders of lung: Secondary | ICD-10-CM

## 2012-02-16 MED ORDER — ALPRAZOLAM 0.5 MG PO TABS: ORAL_TABLET | ORAL | Status: DC

## 2012-02-16 NOTE — Patient Instructions (Addendum)
Take mucinex 600 twice daily x 7ds If no better , fill in prednisone 10 mg tabs  Take 2 tabs daily with food x 5ds, then 1 tab daily with food x 5ds then STOP CT scan in 1 yr

## 2012-02-16 NOTE — Assessment & Plan Note (Signed)
CT scan in 1 yr- dec '14

## 2012-02-16 NOTE — Assessment & Plan Note (Addendum)
Take mucinex 600 twice daily x 7ds If no better , fill in prednisone 10 mg tabs  Take 2 tabs daily with food x 5ds, then 1 tab daily with food x 5ds then STOP pulm rehab referral OK to stop daliresp

## 2012-02-16 NOTE — Progress Notes (Signed)
  Subjective:    Patient ID: Aaron [name], male    DOB: [birth year], 61 y.o.   MRN: [id number]  HPI 60/M, ex- smoker, quit '10 for FU of pulm nodules & COPD Gold stg 3 -FEV1 31%  First noted in bilat upper lobes in 6/09, but new nodules keep appearing & resolving on serial CTs. On CT in march'12 appear to be inflammatory & scarring.  On xanax for anxiety.   March 16, 2009 - -changed lisinopril to benicar.  09/16/2010 -daliresp caused diarrhea  Started Pulm rehab x 2 weeks - then caught a cold.  severe COPD FEV1 31% 6/09, DLCO 49%, high TLC c/w emphysema  10/09 COPD exacerbation >> hospitalised - improved remarkably with steroids again  02/16/2012 11m FU -Has 3-4 flares/ yr breathing is slight worse, wheezing, cough w/ clear phlem (occasionally brown phlem and is deep cough). CT dec '13 RUL nodule, unchanged from 07/02/2011. Favor scarring in the left upper lobe. Scattered nodular densities in the right lung are new or minimally  progressive from 12/18/2009. Using nebs thrice daily Spirometry - fev1 19% -lower from 4 y ago   Review of Systems neg for any significant sore throat, dysphagia, itching, sneezing, nasal congestion or excess/ purulent secretions, fever, chills, sweats, unintended wt loss, pleuritic or exertional cp, hempoptysis, orthopnea pnd or change in chronic leg swelling. Also denies presyncope, palpitations, heartburn, abdominal pain, nausea, vomiting, diarrhea or change in bowel or urinary habits, dysuria,hematuria, rash, arthralgias, visual complaints, headache, numbness weakness or ataxia.     Objective:   Physical Exam  Gen. Pleasant,thin man, in no distress, normal affect ENT - no lesions, no post nasal drip Neck: No JVD, no thyromegaly, no carotid bruits Lungs: no use of accessory muscles, no dullness to percussion, clear without rales or rhonchi  Cardiovascular: Rhythm regular, heart sounds  normal, no murmurs or gallops, no peripheral edema Abdomen: soft  and non-tender, no hepatosplenomegaly, BS normal. Musculoskeletal: No deformities, no cyanosis or clubbing Neuro:  alert, non focal        Assessment & Plan:

## 2012-02-21 ENCOUNTER — Other Ambulatory Visit: Payer: Self-pay | Admitting: Pulmonary Disease

## 2012-02-23 ENCOUNTER — Telehealth: Payer: Self-pay | Admitting: Pulmonary Disease

## 2012-02-23 MED ORDER — AZITHROMYCIN 250 MG PO TABS: ORAL_TABLET | ORAL | Status: DC

## 2012-02-23 MED ORDER — PREDNISONE 10 MG PO TABS: ORAL_TABLET | ORAL | Status: DC

## 2012-02-23 NOTE — Telephone Encounter (Signed)
z-pak 

## 2012-02-23 NOTE — Telephone Encounter (Signed)
zpak rx sent as well. Carron Curie, CMA

## 2012-02-23 NOTE — Telephone Encounter (Signed)
I spoke with pt c/o cough w/ green phlem x Saturday, chest congestion, wheezing. No chest tx. Pt is taking mucinex 2 tablets BID. I have resent in prednisone as the pharmacy never received this. Pt is requesting to have an ABX called in as well. Please advise Dr. Vassie Loll thanks  No Known Allergies

## 2012-03-03 NOTE — Telephone Encounter (Signed)
Please advise if okay to refill prednisone thanks

## 2012-03-16 ENCOUNTER — Ambulatory Visit: Payer: Medicare Other | Admitting: Cardiovascular Disease

## 2012-04-06 ENCOUNTER — Other Ambulatory Visit: Payer: Self-pay | Admitting: Pulmonary Disease

## 2012-04-06 DIAGNOSIS — J449 Chronic obstructive pulmonary disease, unspecified: Secondary | ICD-10-CM

## 2012-04-08 ENCOUNTER — Ambulatory Visit (INDEPENDENT_AMBULATORY_CARE_PROVIDER_SITE_OTHER): Payer: Medicare Other | Admitting: Pulmonary Disease

## 2012-04-08 DIAGNOSIS — J449 Chronic obstructive pulmonary disease, unspecified: Secondary | ICD-10-CM

## 2012-04-08 LAB — PULMONARY FUNCTION TEST

## 2012-04-08 NOTE — Progress Notes (Signed)
Spirometry before and after done today. 

## 2012-04-20 ENCOUNTER — Other Ambulatory Visit: Payer: Self-pay | Admitting: Adult Health

## 2012-04-21 ENCOUNTER — Encounter: Payer: Self-pay | Admitting: Pulmonary Disease

## 2012-05-12 ENCOUNTER — Telehealth: Payer: Self-pay | Admitting: Pulmonary Disease

## 2012-05-12 MED ORDER — PREDNISONE 10 MG PO TABS: ORAL_TABLET | ORAL | Status: DC

## 2012-05-12 NOTE — Telephone Encounter (Signed)
Pred 10 mg Take 4 tabs  daily with food x 4 days, then 3 tabs daily x 4 days, then 2 tabs daily x 4 days, then 1 tab daily x4 days then stop. #40  

## 2012-05-12 NOTE — Telephone Encounter (Signed)
Spoke with pt and notified of recs per RA  Rx has been sent to [facility]

## 2012-05-12 NOTE — Telephone Encounter (Signed)
Will forward to RA per his request Thanks!!

## 2012-05-12 NOTE — Telephone Encounter (Signed)
Spoke with pt He states developed a cold about a month ago, and for the past 2 wks having some increased SOB He is coughing up minimal clear sputum, and c/o "rattle" in his chest. States that the "rattle" is not really something new though No CP, chest tightness, f/c/s No appt's today, I offered ov with RA tomorrow, but pt refused this He is asking for a pred taper KC, please advise, thanks! No Known Allergies

## 2012-05-19 ENCOUNTER — Other Ambulatory Visit: Payer: Self-pay | Admitting: Pulmonary Disease

## 2012-05-20 ENCOUNTER — Telehealth: Payer: Self-pay | Admitting: Pulmonary Disease

## 2012-05-20 NOTE — Telephone Encounter (Signed)
I spoke with pt and is aware RX's were sent earlier. He voiced his understanding. Nothing further was needed

## 2012-05-27 ENCOUNTER — Encounter (INDEPENDENT_AMBULATORY_CARE_PROVIDER_SITE_OTHER): Payer: Medicare Other

## 2012-05-27 DIAGNOSIS — I714 Abdominal aortic aneurysm, without rupture: Secondary | ICD-10-CM

## 2012-06-07 ENCOUNTER — Telehealth: Payer: Self-pay | Admitting: Pulmonary Disease

## 2012-06-07 DIAGNOSIS — J449 Chronic obstructive pulmonary disease, unspecified: Secondary | ICD-10-CM

## 2012-06-07 NOTE — Telephone Encounter (Signed)
Pt aware order placed. Nothing further was needed

## 2012-06-09 ENCOUNTER — Telehealth: Payer: Self-pay | Admitting: *Deleted

## 2012-06-09 NOTE — Telephone Encounter (Signed)
Message copied by Eustace Moore on Wed Jun 09, 2012  9:49 AM ------      Message from: Lorine Bears A      Created: Wed Jun 02, 2012  5:16 PM       I don't think I have seen this patient before. The AAA is slightly larger. Recommend a follow up abdominal aortic duplex in 1 year. ------

## 2012-06-09 NOTE — Telephone Encounter (Signed)
Patient informed. 

## 2012-06-11 ENCOUNTER — Ambulatory Visit (INDEPENDENT_AMBULATORY_CARE_PROVIDER_SITE_OTHER): Payer: Medicare Other | Admitting: Physician Assistant

## 2012-06-11 ENCOUNTER — Encounter: Payer: Self-pay | Admitting: Physician Assistant

## 2012-06-11 VITALS — BP 106/66 | HR 64 | Ht 64.0 in | Wt 114.1 lb

## 2012-06-11 DIAGNOSIS — E785 Hyperlipidemia, unspecified: Secondary | ICD-10-CM

## 2012-06-11 DIAGNOSIS — I714 Abdominal aortic aneurysm, without rupture: Secondary | ICD-10-CM

## 2012-06-11 DIAGNOSIS — J984 Other disorders of lung: Secondary | ICD-10-CM

## 2012-06-11 DIAGNOSIS — I255 Ischemic cardiomyopathy: Secondary | ICD-10-CM | POA: Insufficient documentation

## 2012-06-11 DIAGNOSIS — I2510 Atherosclerotic heart disease of native coronary artery without angina pectoris: Secondary | ICD-10-CM | POA: Insufficient documentation

## 2012-06-11 DIAGNOSIS — I739 Peripheral vascular disease, unspecified: Secondary | ICD-10-CM | POA: Insufficient documentation

## 2012-06-11 DIAGNOSIS — I10 Essential (primary) hypertension: Secondary | ICD-10-CM | POA: Insufficient documentation

## 2012-06-11 DIAGNOSIS — I2589 Other forms of chronic ischemic heart disease: Secondary | ICD-10-CM

## 2012-06-11 NOTE — Assessment & Plan Note (Signed)
Well-controlled on low-dose Lipitor, with LDL 74, 09/2011. Repeat FLP in one year.

## 2012-06-11 NOTE — Assessment & Plan Note (Signed)
Stable on current medication regimen, which includes DAPT. Given his long-standing history of CAD, with prior MIs and multiple PCIs, I would recommend that he remain on this combination therapy regimen, indefinitely.

## 2012-06-11 NOTE — Patient Instructions (Signed)
   AAA Ultrasound in 05/2013  Labs (FLP, LFT) - 09/2012 Continue all current medications. Your physician wants you to follow up in: 6 months.  You will receive a reminder letter in the mail one-two months in advance.  If you don't receive a letter, please call our office to schedule the follow up appointment

## 2012-06-11 NOTE — Assessment & Plan Note (Signed)
Well-controlled on current regimen. ?

## 2012-06-11 NOTE — Assessment & Plan Note (Signed)
Followed by [facility] in [place]

## 2012-06-11 NOTE — Assessment & Plan Note (Signed)
Result of recent surveillance AAA ultrasound were reviewed, with recommendation for repeat study in one year.

## 2012-06-11 NOTE — Progress Notes (Signed)
Primary Cardiologist: Mady Gemma, MD (new)   HPI: Scheduled four-month followup.  Patient denies any interim development of exertional CP. He has chronic exertional dyspnea, related to severe COPD/emphysema, and is followed by Pulmonology in [place]. He denies any exacerbation from his baseline level of exercise tolerance. He denies tachycardia palpitations.  No Known Allergies  Current Outpatient Prescriptions  Medication Sig Dispense Refill  . ADVAIR DISKUS 250-50 MCG/DOSE AEPB INHALE 1 PUFF EVERY 12 HOURS.  60 each  2  . albuterol (PROVENTIL) (2.5 MG/3ML) 0.083% nebulizer solution USE 1 VIAL IN NEBULIZER TWICE DAILY MUST CHECK WITH CINDY BEFORE FILL  180 mL  3  . ALPRAZolam (XANAX) 0.5 MG tablet Take one every 12 hours as needed  60 tablet  5  . aspirin EC 81 MG tablet Take 1 tablet (81 mg total) by mouth daily.      Marland Kitchen atorvastatin (LIPITOR) 20 MG tablet Take 1 tablet (20 mg total) by mouth daily.  90 tablet  2  . BENICAR 20 MG tablet TAKE 1/2 TABLET BY MOUTH DAILY  15 tablet  2  . bisoprolol (ZEBETA) 5 MG tablet TAKE 1 TABLET BY MOUTH EVERY DAY - EMERGENCY REFILL FAXED DR  30 tablet  6  . clopidogrel (PLAVIX) 75 MG tablet TAKE 1 TABLET BY MOUTH DAILY. EMERGENCY REFILL FAXED DR  30 tablet  1  . nitroGLYCERIN (NITROSTAT) 0.4 MG SL tablet Place 1 tablet (0.4 mg total) under the tongue every 5 (five) minutes as needed.  25 tablet  3  . PROAIR HFA 108 (90 BASE) MCG/ACT inhaler INHALE 2 PUFFS EVERY SIX HOURS AS NEEDED FOR WHEEZING  8.5 g  2  . SPIRIVA HANDIHALER 18 MCG inhalation capsule PLACE 1 CAPSULE INTO INHALER AND INHALE DAILY.  30 capsule  2   No current facility-administered medications for this visit.    Past Medical History  Diagnosis Date  . COPD (chronic obstructive pulmonary disease)     severe; bullos emphysema. longstanding tobacco smoking   . Tobacco abuse   . Pulmonary nodule   . History of heart attack   . Hypertension   . Asthma   . PVD (peripheral vascular  disease)     Fusiform distal AAA, with moderate thrombus (3.7 cm x 3.2 cm x 5.1 cm long); moderate aortoiliac atherosclerosis, 05/2012;LLE claudication with 0.90 ABI; 0.94 on rt, 06/2007  . CAD (coronary artery disease)     severe single-vessel coronary arteries w/mildly reduced EF 45% sginif. for her wall motion abnml. chronically occluded rt coronary artery.   . Ischemic cardiomyopathy     severe; EF 40-45% w/inferior apical akenesis by 2D echo 4/09. EF 30-35% by cardiac cath in '04. s/p MSTEMI/bare metal stenting of prximal LAD 10/04. residual, chronic 100% distal RCA stenosis. s/p MI/PCI  in [address]; HRSA/stenting in 1996 [facility])  . VT (ventricular tachycardia)     hx of nonsustained; noninducible electophysiology study in 2004  . Orthostatic hypotension     hx  . Dyslipidemia     statin intolerant    Past Surgical History  Procedure Laterality Date  . Angioplasty  2004    History   Social History  . Marital Status: Divorced    Spouse Name: N/A    Number of Children: N/A  . 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 Smoking status: Former Smoker -- 1.00 packs/day for 48 years    Types: Cigarettes  Quit date: 02/18/2008  . Smokeless tobacco: Never Used  . Alcohol Use: 1.1 oz/week    1 Cans of beer, 1 Drinks containing 0.5 oz of alcohol per week  . Drug Use: No  . Sexually Active: Not on file   Other Topics Concern  . Not on file   Social History Narrative  . No narrative on file    Family History  Problem Relation Age of Onset  . Emphysema Brother   . Lung cancer Father     ROS: no nausea, vomiting; no fever, chills; no melena, hematochezia; no claudication  PHYSICAL EXAM: BP 106/66  Pulse 64  Ht 5\' 4"  (1.626 m)  Wt 114 lb 1.9 oz (51.764 kg)  BMI 19.58 kg/m2 GENERAL: 62 year old male; NAD  HEENT: NCAT, PERRLA, EOMI; sclera clear; no xanthelasma  NECK: palpable bilateral carotid pulses, no  bruits; no JVD; no TM  LUNGS: Diminished breath sounds  CARDIAC: RRR (S1, S2); diminished heart sounds; no significant murmurs; no rubs or gallops  ABDOMEN: soft, non-tender; intact BS  EXTREMETIES: no significant peripheral edema  SKIN: warm/dry; no obvious rash/lesions  MUSCULOSKELETAL: no joint deformity  NEURO: no focal deficit; NL affect    EKG:    ASSESSMENT & PLAN:  CAD (coronary artery disease) Stable on current medication regimen, which includes DAPT. Given his long-standing history of CAD, with prior MIs and multiple PCIs, I would recommend that he remain on this combination therapy regimen, indefinitely.  AAA Result of recent surveillance AAA ultrasound were reviewed, with recommendation for repeat study in one year.  Dyslipidemia Well-controlled on low-dose Lipitor, with LDL 74, 09/2011. Repeat FLP in one year.  Hypertension Well-controlled on current regimen  PULMONARY NODULE Followed by [facility] in [place]    Gene Cinsere Mizrahi, PAC

## 2012-06-16 ENCOUNTER — Ambulatory Visit: Payer: Medicare Other | Admitting: Adult Health

## 2012-06-17 ENCOUNTER — Ambulatory Visit (INDEPENDENT_AMBULATORY_CARE_PROVIDER_SITE_OTHER): Payer: Medicare Other | Admitting: Adult Health

## 2012-06-17 ENCOUNTER — Encounter: Payer: Self-pay | Admitting: Adult Health

## 2012-06-17 VITALS — BP 110/72 | HR 60 | Temp 98.4°F | Ht 64.0 in | Wt 113.8 lb

## 2012-06-17 DIAGNOSIS — J441 Chronic obstructive pulmonary disease with (acute) exacerbation: Secondary | ICD-10-CM

## 2012-06-17 MED ORDER — PREDNISONE 10 MG PO TABS: ORAL_TABLET | ORAL | Status: DC

## 2012-06-17 NOTE — Patient Instructions (Addendum)
Prednisone taper over next week.  Nasonex 1 puff Twice daily  Until sample is gone.  Mucinex DM Twice daily  As needed  Cough/congestion  Fluids and rest  Please contact office for sooner follow up if symptoms do not improve or worsen or seek emergency care  follow up Dr. Vassie Loll  In 3 months

## 2012-06-22 NOTE — Progress Notes (Signed)
  Subjective:    Patient ID: Aaron [name], male    DOB: Jul 10, 1950, 62 y.o.   MRN: [id number]  HPI  60/M, ex- smoker, quit '10 for FU of pulm nodules & COPD Gold stg 3 -FEV1 31%  First noted in bilat upper lobes in 6/09, but new nodules keep appearing & resolving on serial CTs. On CT in march'12 appear to be inflammatory & scarring.  On xanax for anxiety.   March 16, 2009 - -changed lisinopril to benicar.  09/16/2010 -daliresp caused diarrhea  Started Pulm rehab x 2 weeks - then caught a cold.  severe COPD FEV1 31% 6/09, DLCO 49%, high TLC c/w emphysema  10/09 COPD exacerbation >> hospitalised - improved remarkably with steroids again  02/16/12 41m FU -Has 3-4 flares/ yr breathing is slight worse, wheezing, cough w/ clear phlem (occasionally brown phlem and is deep cough). CT dec '13 RUL nodule, unchanged from 07/02/2011. Favor scarring in the left upper lobe. Scattered nodular densities in the right lung are new or minimally  progressive from 12/18/2009. Using nebs thrice daily Spirometry - fev1 19% -lower from 4 y ago  06/17/12 Follow up  4 month follow up COPD - c/o prod cough with clear mucus, increased SOB, wheezing, occ chest tightness x1 month with the season change.  denies f/c/s No hemoptysis, chest pain or edema.   Review of Systems  neg for any significant sore throat, dysphagia, itching, sneezing, nasal congestion or excess/ purulent secretions, fever, chills, sweats, unintended wt loss, pleuritic or exertional cp, hempoptysis, orthopnea pnd or change in chronic leg swelling. Also denies presyncope, palpitations, heartburn, abdominal pain, nausea, vomiting, diarrhea or change in bowel or urinary habits, dysuria,hematuria, rash, arthralgias, visual complaints, headache, numbness weakness or ataxia.     Objective:   Physical Exam   Gen. Pleasant,thin man, in no distress, normal affect ENT - no lesions, no post nasal drip Neck: No JVD, no thyromegaly, no carotid  bruits Lungs: no use of accessory muscles, no dullness to percussion,few exp wheezes  Cardiovascular: Rhythm regular, heart sounds  normal, no murmurs or gallops, no peripheral edema Abdomen: soft and non-tender, no hepatosplenomegaly, BS normal. Musculoskeletal: No deformities, no cyanosis or clubbing Neuro:  alert, non focal        Assessment & Plan:

## 2012-06-22 NOTE — Assessment & Plan Note (Signed)
Flare   Plan  Prednisone taper over next week.  Nasonex 1 puff Twice daily  Until sample is gone.  Mucinex DM Twice daily  As needed  Cough/congestion  Fluids and rest  Please contact office for sooner follow up if symptoms do not improve or worsen or seek emergency care  follow up Dr. Vassie Loll  In 3 months

## 2012-07-14 ENCOUNTER — Telehealth: Payer: Self-pay | Admitting: Pulmonary Disease

## 2012-07-14 ENCOUNTER — Telehealth (HOSPITAL_COMMUNITY): Payer: Self-pay | Admitting: *Deleted

## 2012-07-14 MED ORDER — ALPRAZOLAM 0.5 MG PO TABS: ORAL_TABLET | ORAL | Status: DC

## 2012-07-14 NOTE — Telephone Encounter (Signed)
Call plac regarding Pulmonary Rehab, patient wants to participate in [place], closer to home.  Paperwork faxed to [facility].

## 2012-07-14 NOTE — Telephone Encounter (Signed)
Last ov w/ TP 5.1.14: Patient Instructions    Prednisone taper over next week.  Nasonex 1 puff Twice daily Until sample is gone.  Mucinex DM Twice daily As needed Cough/congestion  Fluids and rest  Please contact office for sooner follow up if symptoms do not improve or worsen or seek emergency care  follow up Dr. Vassie Loll In 3 months    Called spoke with patient who stated that his pharmacy [facility] keeps giving him blue xanax pills where he has to take 1/2 tab, where he has always taken the "peach ones" that equal 0.5mg  that he takes 1 whole tab q12h prn.  Pt stated that the pharmacy was supposed to fax the office.  Advised pt will call the pharmacy to inquire.  Called [facility] and spoke with pharmacist Jillyn Hidden.  Per Jillyn Hidden when pt's previous pharmacy [facility] transferred pt's rx, the xanax rx read 1mg  tab   1/2 tab q12h prn.  New rx given verbally to [facility] as last filled by RA > xanax 0.5mg  #60   1tab po q12h prn  W/ 5 additional refills.    Pt is aware this is now taken care of. Med list updated. Nothing further needed; will sign off.

## 2012-07-19 ENCOUNTER — Other Ambulatory Visit: Payer: Self-pay | Admitting: *Deleted

## 2012-07-19 MED ORDER — BISOPROLOL FUMARATE 5 MG PO TABS: 5.0000 mg | ORAL_TABLET | Freq: Every day | ORAL | Status: DC

## 2012-07-20 ENCOUNTER — Other Ambulatory Visit: Payer: Self-pay | Admitting: *Deleted

## 2012-07-20 ENCOUNTER — Encounter: Payer: Self-pay | Admitting: *Deleted

## 2012-07-20 DIAGNOSIS — I2510 Atherosclerotic heart disease of native coronary artery without angina pectoris: Secondary | ICD-10-CM

## 2012-07-20 DIAGNOSIS — E785 Hyperlipidemia, unspecified: Secondary | ICD-10-CM

## 2012-07-20 DIAGNOSIS — Z79899 Other long term (current) drug therapy: Secondary | ICD-10-CM

## 2012-08-12 ENCOUNTER — Other Ambulatory Visit: Payer: Self-pay | Admitting: Pulmonary Disease

## 2012-08-21 ENCOUNTER — Encounter: Payer: Self-pay | Admitting: Cardiovascular Disease

## 2012-08-21 DIAGNOSIS — R509 Fever, unspecified: Secondary | ICD-10-CM

## 2012-08-24 ENCOUNTER — Encounter (HOSPITAL_COMMUNITY): Payer: Medicare Other

## 2012-08-24 ENCOUNTER — Other Ambulatory Visit: Payer: Self-pay | Admitting: Pulmonary Disease

## 2012-09-06 ENCOUNTER — Telehealth: Payer: Self-pay | Admitting: *Deleted

## 2012-09-06 DIAGNOSIS — I739 Peripheral vascular disease, unspecified: Secondary | ICD-10-CM

## 2012-09-06 DIAGNOSIS — F17200 Nicotine dependence, unspecified, uncomplicated: Secondary | ICD-10-CM

## 2012-09-06 NOTE — Telephone Encounter (Signed)
Understood. Let's arrange a Duplex US of left arm to r/o superficial thrombophlebitis, and then have pt see me in clinic, later this week.

## 2012-09-06 NOTE — Telephone Encounter (Signed)
Patient called requesting an appointment for light headedness/dizziness with lower than usual BP 100/56. Patient denies chest pain or sob. Patient also said that he felt a very hard area in the vein of his left lower arm. Patient was taken off benicar times 5 days due to these symptoms by his PCP. Patient encouraged to call PCP with these symptoms but stated that he felt more comfortable with Gene whom he's seen in the past. Patient is concerned that he may have a clot. Please advise

## 2012-09-07 ENCOUNTER — Other Ambulatory Visit: Payer: Self-pay | Admitting: *Deleted

## 2012-09-07 DIAGNOSIS — F17200 Nicotine dependence, unspecified, uncomplicated: Secondary | ICD-10-CM

## 2012-09-07 DIAGNOSIS — I2510 Atherosclerotic heart disease of native coronary artery without angina pectoris: Secondary | ICD-10-CM

## 2012-09-07 DIAGNOSIS — I739 Peripheral vascular disease, unspecified: Secondary | ICD-10-CM

## 2012-09-07 NOTE — Telephone Encounter (Signed)
Patient informed and verbalized understanding of plan. 

## 2012-09-08 ENCOUNTER — Encounter (INDEPENDENT_AMBULATORY_CARE_PROVIDER_SITE_OTHER): Payer: Medicare Other

## 2012-09-08 DIAGNOSIS — F17200 Nicotine dependence, unspecified, uncomplicated: Secondary | ICD-10-CM

## 2012-09-08 DIAGNOSIS — I2510 Atherosclerotic heart disease of native coronary artery without angina pectoris: Secondary | ICD-10-CM

## 2012-09-08 DIAGNOSIS — I739 Peripheral vascular disease, unspecified: Secondary | ICD-10-CM

## 2012-09-08 DIAGNOSIS — M79609 Pain in unspecified limb: Secondary | ICD-10-CM

## 2012-09-08 DIAGNOSIS — R229 Localized swelling, mass and lump, unspecified: Secondary | ICD-10-CM

## 2012-09-10 ENCOUNTER — Ambulatory Visit (INDEPENDENT_AMBULATORY_CARE_PROVIDER_SITE_OTHER): Payer: Medicare Other | Admitting: Physician Assistant

## 2012-09-10 ENCOUNTER — Encounter: Payer: Self-pay | Admitting: Physician Assistant

## 2012-09-10 VITALS — BP 132/81 | HR 62 | Ht 64.0 in | Wt 110.0 lb

## 2012-09-10 DIAGNOSIS — I10 Essential (primary) hypertension: Secondary | ICD-10-CM

## 2012-09-10 DIAGNOSIS — I2589 Other forms of chronic ischemic heart disease: Secondary | ICD-10-CM

## 2012-09-10 DIAGNOSIS — I255 Ischemic cardiomyopathy: Secondary | ICD-10-CM

## 2012-09-10 DIAGNOSIS — I714 Abdominal aortic aneurysm, without rupture: Secondary | ICD-10-CM

## 2012-09-10 MED ORDER — OLMESARTAN 10 MG HALF TABLET: ORAL_TABLET | ORAL | Status: DC

## 2012-09-10 NOTE — Assessment & Plan Note (Signed)
Stable on current medication regimen, which includes lifelong DAPT. Will resume treatment with ARB at previous dose of 10 mg daily, with close monitoring of BP at home. I suspect that his recent relative hypotension was secondary to dehydration, in the setting of pneumonia. He has otherwise done quite well on this medication, prior to his recent hospitalization. His most recent assessment of LVF was by nuclear imaging in August 2013, calculated at 59%.

## 2012-09-10 NOTE — Patient Instructions (Addendum)
Your physician has recommended you make the following change in your medication:   START YOUR BENICAR 10 MG ONCE A DAY  CONTINUE WITH ALL OTHER MEDICATIONS  CALL OFFICE IF SYSTOLIC NUMBER OF YOUR BLOOD PRESSURE IS LESS THEN OR EQUAL TO 100  Your physician wants you to follow-up in: 6 MONTHS WITH DR.KONEWARAN You will receive a reminder letter in the mail two months in advance. If you don't receive a letter, please call our office to schedule the follow-up appointment.

## 2012-09-10 NOTE — Assessment & Plan Note (Signed)
Patient will be due for an annual surveillance abdominal ultrasound in April 2015.

## 2012-09-10 NOTE — Assessment & Plan Note (Signed)
Will continue to monitor closely for recurrent hypotension, following addition of Benicar at previous dose of 10 mg daily.

## 2012-09-10 NOTE — Progress Notes (Signed)
Primary Cardiologist: Prentice Docker, MD   HPI: Patient seen as an add-on for evaluation of possible LUE thrombus, and recent development of relative hypotension.  In preparation for this visit, I ordered a LUE venous duplex scan, which reportedly was negative for thrombus (per preliminary report).  Patient was recently hospitalized here at [facility] with bilateral pneumonia. During recent post hospital followup with Dr. Sherril Croon, he was found to have a SBP of 100, which had been essentially persistent since his recent hospitalization. Accordingly, Dr. Sherril Croon recommended stopping Benicar, and deferred to [place] as to whether or not to resume this medication.  No Known Allergies  Current Outpatient Prescriptions  Medication Sig Dispense Refill  . ADVAIR DISKUS 250-50 MCG/DOSE AEPB INHALE ONE PUFF EVERY TWELVE HOURS  60 each  4  . albuterol (PROVENTIL) (2.5 MG/3ML) 0.083% nebulizer solution USE ONE VIAL IN NEBULIZER TWICE DAILY  180 mL  4  . ALPRAZolam (XANAX) 0.5 MG tablet Take one every 12 hours as needed  60 tablet  5  . aspirin EC 81 MG tablet Take 1 tablet (81 mg total) by mouth daily.      Marland Kitchen atorvastatin (LIPITOR) 20 MG tablet Take 1 tablet (20 mg total) by mouth daily.  90 tablet  2  . bisoprolol (ZEBETA) 5 MG tablet Take 1 tablet (5 mg total) by mouth daily.  30 tablet  6  . clopidogrel (PLAVIX) 75 MG tablet TAKE 1 TABLET BY MOUTH DAILY. EMERGENCY REFILL FAXED DR  30 tablet  1  . nitroGLYCERIN (NITROSTAT) 0.4 MG SL tablet Place 1 tablet (0.4 mg total) under the tongue every 5 (five) minutes as needed.  25 tablet  3  . PROAIR HFA 108 (90 BASE) MCG/ACT inhaler INHALE TWO PUFFS EVERY 6 HOURS AS NEEDED FOR WHEEZING  8.5 g  4  . SPIRIVA HANDIHALER 18 MCG inhalation capsule PLACE ONE CAPSULE IN MOUTHPIECE (HANDIHALER) AND INHALE IN MOUTH ONCE A DAY.  30 capsule  4  . olmesartan (BENICAR) 10 mg TABS Take 10 mg once a day  30 tablet  6   No current facility-administered medications for this visit.     Past Medical History  Diagnosis Date  . COPD (chronic obstructive pulmonary disease)     severe; bullos emphysema. longstanding tobacco smoking   . Tobacco abuse   . Pulmonary nodule   . History of heart attack   . Hypertension   . Asthma   . PVD (peripheral vascular disease)     Fusiform distal AAA, with moderate thrombus (3.7 cm x 3.2 cm x 5.1 cm long); moderate aortoiliac atherosclerosis, 05/2012;LLE claudication with 0.90 ABI; 0.94 on rt, 06/2007  . CAD (coronary artery disease)     severe single-vessel coronary arteries w/mildly reduced EF 45% sginif. for her wall motion abnml. chronically occluded rt coronary artery.   . Ischemic cardiomyopathy     Dobutamine stress CL, 09/2011: No large areas of ischemia; EF 59%; severe; EF 40-45% w/inferior apical akenesis by 2D echo 4/09. EF 30-35% by cardiac cath in '04. s/p MSTEMI/bare metal stenting of prximal LAD 10/04. residual, chronic 100% distal RCA stenosis. s/p MI/PCI  in 284[address]; HRSA/stenting in 1996 [facility])  . VT (ventricular tachycardia)     hx of nonsustained; noninducible electophysiology study in 2004  . Orthostatic hypotension     hx  . Dyslipidemia     statin intolerant    Past Surgical History  Procedure Laterality Date  . Angioplasty  2004    History   Social History  .  Marital Status: Divorced    Spouse Name: N/A    Number of Children: N/A  . 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 Smoking status: Former Smoker -- 1.00 packs/day for 48 years    Types: Cigarettes    Quit date: 02/18/2008  . Smokeless tobacco: Never Used  . Alcohol Use: 1.1 oz/week    1 Cans of beer, 1 Drinks containing 0.5 oz of alcohol per week  . Drug Use: No  . Sexually Active: Not on file   Other Topics Concern  . Not on file   Social History Narrative  . No narrative on file    Family History  Problem Relation Age of Onset  . Emphysema Brother   . Lung cancer  Father     ROS: no nausea, vomiting; no fever, chills; no melena, hematochezia; no claudication  PHYSICAL EXAM: BP 132/81  Pulse 62  Ht 5\' 4"  (1.626 m)  Wt 110 lb (49.896 kg)  BMI 18.87 kg/m2 GENERAL: 62 year old male; NAD  HEENT: NCAT, PERRLA, EOMI; sclera clear; no xanthelasma  NECK: palpable bilateral carotid pulses, no bruits; no JVD; no TM  LUNGS: Diminished breath sounds  CARDIAC: RRR (S1, S2); diminished heart sounds; no significant murmurs; no rubs or gallops  ABDOMEN: soft, non-tender; intact BS  EXTREMETIES: Small hematoma on surface of left forearm (less than 1 inch diameter); no significant peripheral edema  SKIN: warm/dry; no obvious rash/lesions  MUSCULOSKELETAL: no joint deformity  NEURO: no focal deficit; NL affect    EKG:    ASSESSMENT & PLAN:  Ischemic cardiomyopathy Stable on current medication regimen, which includes lifelong DAPT. Will resume treatment with ARB at previous dose of 10 mg daily, with close monitoring of BP at home. I suspect that his recent relative hypotension was secondary to dehydration, in the setting of pneumonia. He has otherwise done quite well on this medication, prior to his recent hospitalization. His most recent assessment of LVF was by nuclear imaging in August 2013, calculated at 59%.  HYPERTENSION Will continue to monitor closely for recurrent hypotension, following addition of Benicar at previous dose of 10 mg daily.   AAA Patient will be due for an annual surveillance abdominal ultrasound in April 2015.    Gene Rielly Corlett, PAC

## 2012-09-13 ENCOUNTER — Telehealth: Payer: Self-pay | Admitting: Cardiology

## 2012-09-13 MED ORDER — OLMESARTAN 10 MG HALF TABLET: 5.0000 mg | ORAL_TABLET | Freq: Every day | ORAL | Status: DC

## 2012-09-13 NOTE — Telephone Encounter (Signed)
Patient informed to hold benicar for Tuesday and Wednesday and then to start back taking it at 5 mg (1/2 tablets) and to notify us if his systolic bp is 110 or under.

## 2012-09-13 NOTE — Telephone Encounter (Signed)
Recent OV note reviewed. Recommend patient hold Benicar for 2 days, then resume at lower dose of 5 mg daily. Continue to monitor BP at home, and notify us for SBP readings less than 110.

## 2012-09-13 NOTE — Telephone Encounter (Signed)
Pt called with blood pressure reading today of 102/56 this morning. Wants to know if he still needs to take Benicar?

## 2012-09-17 ENCOUNTER — Other Ambulatory Visit: Payer: Self-pay | Admitting: Physician Assistant

## 2013-01-11 ENCOUNTER — Other Ambulatory Visit: Payer: Self-pay | Admitting: Pulmonary Disease

## 2013-01-11 ENCOUNTER — Telehealth: Payer: Self-pay | Admitting: Pulmonary Disease

## 2013-01-11 NOTE — Telephone Encounter (Signed)
Last OV 01/30/12 Pending OV 02/28/13 Last fill 07/14/12 with 5 additional refills.  RA - please advise on refill. Thanks.  Pt is aware that he will have to keep appointment in order to receive further refills.

## 2013-01-12 MED ORDER — ALPRAZOLAM 0.5 MG PO TABS: ORAL_TABLET | ORAL | Status: DC

## 2013-01-12 NOTE — Telephone Encounter (Signed)
I called and made pt aware of recs. We are awaiting answer from RA. Please advise thanks

## 2013-01-12 NOTE — Telephone Encounter (Signed)
Patient calling back asking about the status of refill.

## 2013-01-12 NOTE — Telephone Encounter (Signed)
Refill sent to [facility]. Xanax .5mg  Take 1 q12hr #60 x 5 refills. Pt has appt to see TP 02/28/13 at 345

## 2013-01-12 NOTE — Telephone Encounter (Signed)
Ok to refill Pl make FU OV with TP

## 2013-01-21 ENCOUNTER — Encounter: Payer: Self-pay | Admitting: Cardiovascular Disease

## 2013-01-22 ENCOUNTER — Encounter: Payer: Self-pay | Admitting: Cardiovascular Disease

## 2013-01-24 ENCOUNTER — Encounter: Payer: Self-pay | Admitting: Cardiovascular Disease

## 2013-02-15 ENCOUNTER — Other Ambulatory Visit: Payer: Self-pay | Admitting: Cardiology

## 2013-02-15 MED ORDER — CLOPIDOGREL BISULFATE 75 MG PO TABS: ORAL_TABLET | ORAL | Status: DC

## 2013-02-18 ENCOUNTER — Other Ambulatory Visit: Payer: Self-pay | Admitting: Pulmonary Disease

## 2013-02-28 ENCOUNTER — Ambulatory Visit (INDEPENDENT_AMBULATORY_CARE_PROVIDER_SITE_OTHER): Payer: Medicare Other | Admitting: Pulmonary Disease

## 2013-02-28 ENCOUNTER — Ambulatory Visit (INDEPENDENT_AMBULATORY_CARE_PROVIDER_SITE_OTHER)
Admission: RE | Admit: 2013-02-28 | Discharge: 2013-02-28 | Disposition: A | Discharge status: Home / Self Care | Payer: Medicare Other | Source: Ambulatory Visit | Attending: Pulmonary Disease | Admitting: Pulmonary Disease

## 2013-02-28 ENCOUNTER — Encounter: Payer: Self-pay | Admitting: Pulmonary Disease

## 2013-02-28 VITALS — BP 114/62 | HR 69 | Temp 98.2°F | Ht 64.0 in | Wt 116.2 lb

## 2013-02-28 DIAGNOSIS — J449 Chronic obstructive pulmonary disease, unspecified: Secondary | ICD-10-CM

## 2013-02-28 DIAGNOSIS — J984 Other disorders of lung: Secondary | ICD-10-CM

## 2013-02-28 NOTE — Assessment & Plan Note (Signed)
Ambulatory satn on RA Stay on advair & spiriva for now - consider stopping advair for recurrent pna Handicapped application filled out

## 2013-02-28 NOTE — Assessment & Plan Note (Addendum)
CXR today - persistent infx LUl - residual from dec? , we will set up CT chest in march 2015

## 2013-02-28 NOTE — Progress Notes (Signed)
   Subjective:    Patient ID: Aaron [name], male    DOB: [birth year], 63 y.o.   MRN: [id number]  HPI  63/M, ex- smoker, quit '10 for FU of pulm nodules & COPD Gold stg 3 -FEV1 31%  First noted in bilat upper lobes in 6/09, but new nodules keep appearing & resolving on serial CTs. On CT in march'12 appear to be inflammatory & scarring.  On xanax for anxiety.  Has 3-4 flares/ yr   2009 - FEV1 31%,DLCO 49%, high TLC c/w emphysema  08/2010 -daliresp caused diarrhea  10/09 COPD exacerbation >> hospitalised  02/16/12 Spirometry - fev1 19% -lower from 4 y ago     02/28/2013  8 month follow up COPD - c/o prod cough with clear mucus, no increased SOB, wheezing, occ chest tightness  denies f/c/s  No hemoptysis, chest pain or edema  Chief Complaint  Patient presents with  . Follow-up    [facility]. Pt reports he still feels short winded, chest congestion, constant coug- at times w/ green phlem. PCP gave pt ABX-levaquin 500 mg QD x 7 days-finished friday. Denies any fever, no chills, no sweats.    Reviewed imaging  From [facility] - CXR July 2014 - BL pna CXR - dec 2014 - left lung infx Last CT dec 2013 CXR showed persistent LUL infx  Review of Systems neg for any significant sore throat, dysphagia, itching, sneezing, nasal congestion or excess/ purulent secretions, fever, chills, sweats, unintended wt loss, pleuritic or exertional cp, hempoptysis, orthopnea pnd or change in chronic leg swelling. Also denies presyncope, palpitations, heartburn, abdominal pain, nausea, vomiting, diarrhea or change in bowel or urinary habits, dysuria,hematuria, rash, arthralgias, visual complaints, headache, numbness weakness or ataxia.     Objective:   Physical Exam  Gen. Pleasant, well-nourished, in no distress ENT - no lesions, no post nasal drip Neck: No JVD, no thyromegaly, no carotid bruits Lungs: no use of accessory muscles, no dullness to percussion,  decreased without rales or rhonchi  Cardiovascular: Rhythm regular, heart sounds  normal, no murmurs or gallops, no peripheral edema Musculoskeletal: No deformities, no cyanosis or clubbing        Assessment & Plan:

## 2013-02-28 NOTE — Patient Instructions (Signed)
CXR today - If pneumonia has cleared , we will set up CT chest in march 2015 Ambulatory satn on RA Stay on advair & spiriva for now Handicapped application filled out

## 2013-03-02 ENCOUNTER — Telehealth: Payer: Self-pay | Admitting: *Deleted

## 2013-03-02 NOTE — Telephone Encounter (Signed)
I spoke with patient about results and he verbalized understanding and had no questions 

## 2013-03-02 NOTE — Telephone Encounter (Signed)
Message copied by Tommie SamsSILVA, Marquell Saenz S on Wed Mar 02, 2013 11:33 AM ------      Message from: Cyril MourningALVA, RAKESH V      Created: Wed Mar 02, 2013 11:20 AM       Infiltrate not fully resolved -hence CT chest inm arch -give it more time to resolve' does not need more antibiotic      ----- Message -----         From: Tommie SamsMindy S Silva, CMA         Sent: 03/02/2013  10:42 AM           To: Oretha Milchakesh V [name], MD            Okay thanks      Regarding pt CXR results, what results should I give him?      ----- Message -----         From: Oretha Milchakesh V [name], MD         Sent: 03/02/2013  10:40 AM           To: Tommie SamsMindy S Silva, CMA            Ignore earlier msg -CT in March ok            RA             ------

## 2013-03-10 ENCOUNTER — Ambulatory Visit (INDEPENDENT_AMBULATORY_CARE_PROVIDER_SITE_OTHER): Payer: Medicare Other | Admitting: Cardiovascular Disease

## 2013-03-10 ENCOUNTER — Encounter: Payer: Self-pay | Admitting: Cardiovascular Disease

## 2013-03-10 VITALS — BP 123/82 | HR 62 | Ht 64.0 in | Wt 111.0 lb

## 2013-03-10 DIAGNOSIS — I2510 Atherosclerotic heart disease of native coronary artery without angina pectoris: Secondary | ICD-10-CM

## 2013-03-10 DIAGNOSIS — E785 Hyperlipidemia, unspecified: Secondary | ICD-10-CM

## 2013-03-10 DIAGNOSIS — I255 Ischemic cardiomyopathy: Secondary | ICD-10-CM

## 2013-03-10 DIAGNOSIS — I2589 Other forms of chronic ischemic heart disease: Secondary | ICD-10-CM

## 2013-03-10 DIAGNOSIS — I714 Abdominal aortic aneurysm, without rupture, unspecified: Secondary | ICD-10-CM

## 2013-03-10 DIAGNOSIS — I10 Essential (primary) hypertension: Secondary | ICD-10-CM

## 2013-03-10 DIAGNOSIS — J449 Chronic obstructive pulmonary disease, unspecified: Secondary | ICD-10-CM

## 2013-03-10 NOTE — Progress Notes (Signed)
Patient ID: Aaron [name], male   DOB: Jun 21, 1950, 63 y.o.   MRN: [id number]      SUBJECTIVE: The patient presents for routine cardiovascular followup. This is my first am evaluating him. He has a history of coronary artery disease he was hospitalized with community-acquired pneumonia in December 2014. and ischemic retinopathy, hypertension, hyperlipidemia, and an abdominal aortic aneurysm. He also has severe bullous emphysema with consequent severe COPD. She is doing quite well and very seldom gets chest discomfort, and when he does it appears to be due to his pulmonary disease. He shortness of breath is stable and he feels this is actually somewhat better. He denies palpitations and leg swelling. He only gets lightheaded and dizzy if he stands up too quickly.    No Known Allergies  Current Outpatient Prescriptions  Medication Sig Dispense Refill  . ADVAIR DISKUS 250-50 MCG/DOSE AEPB INHALE ONE PUFF EVERY TWELVE HOURS  60 each  2  . albuterol (PROVENTIL) (2.5 MG/3ML) 0.083% nebulizer solution USE ONE VIAL IN NEBULIZER TWICE DAILY  180 mL  4  . ALPRAZolam (XANAX) 0.5 MG tablet Take one every 12 hours as needed  60 tablet  5  . aspirin 81 MG tablet Take 81 mg by mouth daily.      Marland Kitchen atorvastatin (LIPITOR) 20 MG tablet TAKE ONE TABLET BY MOUTH DAILY  90 tablet  2  . bisoprolol (ZEBETA) 5 MG tablet Take 1 tablet (5 mg total) by mouth daily.  30 tablet  6  . clopidogrel (PLAVIX) 75 MG tablet TAKE 1 TABLET BY MOUTH DAILY. EMERGENCY REFILL FAXED DR  30 tablet  0  . nitroGLYCERIN (NITROSTAT) 0.4 MG SL tablet Place 1 tablet (0.4 mg total) under the tongue every 5 (five) minutes as needed.  25 tablet  3  . olmesartan (BENICAR) 20 MG tablet Take 5 mg by mouth daily.      Marland Kitchen PROAIR HFA 108 (90 BASE) MCG/ACT inhaler INHALE TWO PUFFS EVERY 6 HOURS AS NEEDED FOR WHEEZING  8.5 g  4  . SPIRIVA HANDIHALER 18 MCG inhalation capsule PLACE ONE CAPSULE IN MOUTHPIECE (HANDIHALER) AND INHALE IN MOUTH ONCE A DAY.   30 capsule  2   No current facility-administered medications for this visit.    Past Medical History  Diagnosis Date  . COPD (chronic obstructive pulmonary disease)     severe; bullos emphysema. longstanding tobacco smoking   . Tobacco abuse   . Pulmonary nodule   . History of heart attack   . Hypertension   . Asthma   . PVD (peripheral vascular disease)     Fusiform distal AAA, with moderate thrombus (3.7 cm x 3.2 cm x 5.1 cm long); moderate aortoiliac atherosclerosis, 05/2012;LLE claudication with 0.90 ABI; 0.94 on rt, 06/2007  . CAD (coronary artery disease)     severe single-vessel coronary arteries w/mildly reduced EF 45% sginif. for her wall motion abnml. chronically occluded rt coronary artery.   . Ischemic cardiomyopathy     Dobutamine stress CL, 09/2011: No large areas of ischemia; EF 59%; severe; EF 40-45% w/inferior apical akenesis by 2D echo 4/09. EF 30-35% by cardiac cath in '04. s/p MSTEMI/bare metal stenting of prximal LAD 10/04. residual, chronic 100% distal RCA stenosis. s/p MI/PCI  in [address]; HRSA/stenting in 1996 [facility])  . VT (ventricular tachycardia)     hx of nonsustained; noninducible electophysiology study in 2004  . Orthostatic hypotension     hx  . Dyslipidemia     statin intolerant  Past Surgical History  Procedure Laterality Date  . Angioplasty  2004    History   Social History  . Marital Status: Divorced    Spouse Name: N/A    Number of Children: N/A  . Years of Education: N/A   Occupational History  . disabled     Education administratorpainter   Social History Main Topics  . Smoking status: Former Smoker -- 1.00 packs/day for 48 years    Types: Cigarettes    Quit date: 02/18/2008  . Smokeless tobacco: Never Used  . Alcohol Use: 1.1 oz/week    1 Cans of beer, 1 Drinks containing 0.5 oz of alcohol per week  . Drug Use: No  . Sexual Activity: Not on file   Other Topics Concern  . Not on file   Social History Narrative  . No narrative on file     Filed  Vitals:   03/10/13 1253  BP: 123/82  Pulse: 62  Height: 5\' 4"  (1.626 m)  Weight: 111 lb (50.349 kg)  SpO2: 100%    PHYSICAL EXAM General: NAD Neck: No JVD, no thyromegaly or thyroid nodule.  Lungs: Clear to auscultation bilaterally with normal respiratory effort. Overall poor air movement. CV: Nondisplaced PMI.  Distant heart sounds, regular S1/S2, no S3/S4, no murmur.  No peripheral edema.  No carotid bruit.  Normal pedal pulses.  Abdomen: Soft, nontender, no hepatosplenomegaly, no distention.  Neurologic: Alert and oriented x 3.  Psych: Normal affect. Extremities: No clubbing or cyanosis.   ECG: reviewed and available in electronic records.      ASSESSMENT AND PLAN: CAD/Ischemic cardiomyopathy  Stable on current medication regimen, which includes lifelong DAPT. His most recent assessment of LVEF was by nuclear imaging in August 2013, calculated at 59%.   HYPERTENSION  Controlled on present therapy.  AAA  Patient will be due for an annual surveillance abdominal ultrasound in April 2015.  Hyperlipidemia Lipids managed by PCP. Continues on Lipitor 20 mg daily.  Dispo: f/u 6 months.   Prentice DockerSuresh Aryiah Monterosso, M.D., F.A.C.C.

## 2013-03-10 NOTE — Patient Instructions (Signed)
Continue all current medications. Your physician wants you to follow up in: 6 months.  You will receive a reminder letter in the mail one-two months in advance.  If you don't receive a letter, please call our office to schedule the follow up appointment   

## 2013-03-11 ENCOUNTER — Other Ambulatory Visit: Payer: Self-pay | Admitting: Physician Assistant

## 2013-03-22 ENCOUNTER — Other Ambulatory Visit: Payer: Self-pay | Admitting: Cardiovascular Disease

## 2013-03-22 ENCOUNTER — Other Ambulatory Visit: Payer: Self-pay | Admitting: Pulmonary Disease

## 2013-05-03 ENCOUNTER — Other Ambulatory Visit: Payer: Medicare Other

## 2013-05-13 ENCOUNTER — Other Ambulatory Visit: Payer: Self-pay | Admitting: Pulmonary Disease

## 2013-05-16 NOTE — Telephone Encounter (Signed)
Pt last had refill alprazolam 12/2012 x 5 refills. Please advise Dr. Vassie LollAlva thanks

## 2013-05-17 NOTE — Telephone Encounter (Signed)
Called RX into [facility]. Made aware also further refills need to come from PCP

## 2013-05-17 NOTE — Telephone Encounter (Signed)
OK to refill Pl ask him to obtain further refills from his PCP

## 2013-05-24 ENCOUNTER — Ambulatory Visit (INDEPENDENT_AMBULATORY_CARE_PROVIDER_SITE_OTHER)
Admission: RE | Admit: 2013-05-24 | Discharge: 2013-05-24 | Disposition: A | Discharge status: Home / Self Care | Payer: Medicare HMO | Source: Ambulatory Visit | Attending: Pulmonary Disease | Admitting: Pulmonary Disease

## 2013-05-24 DIAGNOSIS — J984 Other disorders of lung: Secondary | ICD-10-CM

## 2013-05-30 ENCOUNTER — Encounter: Payer: Self-pay | Admitting: Adult Health

## 2013-05-30 ENCOUNTER — Ambulatory Visit (INDEPENDENT_AMBULATORY_CARE_PROVIDER_SITE_OTHER): Payer: Medicare HMO | Admitting: Adult Health

## 2013-05-30 VITALS — BP 100/68 | HR 76 | Temp 98.1°F | Ht 64.0 in | Wt 108.8 lb

## 2013-05-30 DIAGNOSIS — I2510 Atherosclerotic heart disease of native coronary artery without angina pectoris: Secondary | ICD-10-CM

## 2013-05-30 DIAGNOSIS — J984 Other disorders of lung: Secondary | ICD-10-CM

## 2013-05-30 DIAGNOSIS — J449 Chronic obstructive pulmonary disease, unspecified: Secondary | ICD-10-CM

## 2013-05-30 MED ORDER — LEVOFLOXACIN 500 MG PO TABS: 500.0000 mg | ORAL_TABLET | Freq: Every day | ORAL | Status: AC

## 2013-05-30 MED ORDER — PREDNISONE 10 MG PO TABS: ORAL_TABLET | ORAL | Status: DC

## 2013-05-30 NOTE — Progress Notes (Signed)
   Subjective:    Patient ID: Aaron [name], male    DOB: 06/26/1950, 63 y.o.   MRN: [id number]  HPI   62/M, ex- smoker, quit '10 for FU of pulm nodules & COPD Gold stg 3 -FEV1 31%  First noted in bilat upper lobes in 6/09, but new nodules keep appearing & resolving on serial CTs. On CT in march'12 appear to be inflammatory & scarring.  On xanax for anxiety.  Has 3-4 flares/ yr   2009 - FEV1 31%,DLCO 49%, high TLC c/w emphysema  08/2010 -daliresp caused diarrhea  10/09 COPD exacerbation >> hospitalised  02/16/12 Spirometry - fev1 19% -lower from 4 y ago     02/28/13  8 month follow up COPD - c/o prod cough with clear mucus, no increased SOB, wheezing, occ chest tightness  denies f/c/s  No hemoptysis, chest pain or edema Reviewed imaging  From [facility] - CXR July 2014 - BL pna CXR - dec 2014 - left lung infx Last CT dec 2013 CXR showed persistent LUL infx   05/30/2013  C/o increase SOB and rhinorrhea, chest congestion. C/o increase in cough with clear mucous for 1 week. Denies CP Complains of thick mucus. At times. It yellow over last 2 days Patient had CT chest on April 7 that showed 3 new nodules in the left lower lobe. Largest, of which is 1.5 cm. Air space consolidation of the left upper lobe. Along with emphysematous changes. Patient has been recommended to have a repeat CT chest in 4 months. Patient denies any hemoptysis, orthopnea, PND, or leg swelling  Review of Systems  neg for any significant sore throat, dysphagia, itching, sneezing, nasal congestion or excess/ purulent secretions, fever, chills, sweats, unintended wt loss, pleuritic or exertional cp, hempoptysis, orthopnea pnd or change in chronic leg swelling. Also denies presyncope, palpitations, heartburn, abdominal pain, nausea, vomiting, diarrhea or change in bowel or urinary habits, dysuria,hematuria, rash, arthralgias, visual complaints, headache, numbness weakness or ataxia.     Objective:   Physical  Exam   Gen. Pleasant, well-nourished, in no distress ENT - no lesions, no post nasal drip Neck: No JVD, no thyromegaly, no carotid bruits Lungs: no use of accessory muscles, no dullness to percussion, decreased BS in bases w/ few rhonchi  Cardiovascular: Rhythm regular, heart sounds  normal, no murmurs or gallops, no peripheral edema Musculoskeletal: No deformities, no cyanosis or clubbing        Assessment & Plan:

## 2013-05-30 NOTE — Patient Instructions (Addendum)
Levaquin 500mg  daily for 7 days  Mucinex DM Twice daily .As needed  Cough/congestion  Fluids and rest  Prednisone taper over next week.  Please contact office for sooner follow up if symptoms do not improve or worsen or seek emergency care  Follow up Dr. Vassie LollAlva  In 3-4 weeks with chest xray .  We will have CT chest set up for ~August

## 2013-06-01 NOTE — Assessment & Plan Note (Signed)
  We will have CT chest set up for ~August

## 2013-06-01 NOTE — Assessment & Plan Note (Signed)
Flare   Plan  Levaquin 500mg  daily for 7 days  Mucinex DM Twice daily .As needed  Cough/congestion  Fluids and rest  Prednisone taper over next week.  Please contact office for sooner follow up if symptoms do not improve or worsen or seek emergency care  Follow up Dr. Vassie LollAlva  In 3-4 weeks with chest xray .

## 2013-06-02 ENCOUNTER — Ambulatory Visit: Payer: Medicare HMO | Admitting: Cardiology

## 2013-06-02 ENCOUNTER — Encounter: Payer: Self-pay | Admitting: Cardiovascular Disease

## 2013-06-02 DIAGNOSIS — R0989 Other specified symptoms and signs involving the circulatory and respiratory systems: Secondary | ICD-10-CM

## 2013-06-06 ENCOUNTER — Encounter (HOSPITAL_COMMUNITY): Payer: Self-pay | Admitting: Cardiology

## 2013-06-07 ENCOUNTER — Other Ambulatory Visit: Payer: Self-pay | Admitting: Cardiology

## 2013-06-07 MED ORDER — NITROGLYCERIN 0.4 MG SL SUBL: 0.4000 mg | SUBLINGUAL_TABLET | SUBLINGUAL | Status: AC | PRN

## 2013-06-17 ENCOUNTER — Other Ambulatory Visit: Payer: Self-pay | Admitting: Pulmonary Disease

## 2013-06-21 ENCOUNTER — Encounter: Payer: Self-pay | Admitting: Pulmonary Disease

## 2013-06-21 ENCOUNTER — Ambulatory Visit (INDEPENDENT_AMBULATORY_CARE_PROVIDER_SITE_OTHER): Payer: Medicare HMO | Admitting: Pulmonary Disease

## 2013-06-21 VITALS — BP 160/80 | HR 99 | Temp 98.0°F | Ht 64.0 in | Wt 110.6 lb

## 2013-06-21 DIAGNOSIS — I2510 Atherosclerotic heart disease of native coronary artery without angina pectoris: Secondary | ICD-10-CM

## 2013-06-21 DIAGNOSIS — J984 Other disorders of lung: Secondary | ICD-10-CM

## 2013-06-21 DIAGNOSIS — J441 Chronic obstructive pulmonary disease with (acute) exacerbation: Secondary | ICD-10-CM

## 2013-06-21 MED ORDER — PREDNISONE 10 MG PO TABS: ORAL_TABLET | ORAL | Status: DC

## 2013-06-21 NOTE — Assessment & Plan Note (Signed)
Prednisone 10 mg tabs  Take 2 tabs daily with food x 5ds, then 1 tab daily with food x 5ds then STOP Call for antibiotic if yellow or green phlegm  Ok to take xanax for anxiety

## 2013-06-21 NOTE — Assessment & Plan Note (Signed)
4mth Repeat scan in August - favor inflammatory given ASD in LUL

## 2013-06-21 NOTE — Patient Instructions (Signed)
Prednisone 10 mg tabs  Take 2 tabs daily with food x 5ds, then 1 tab daily with food x 5ds then STOP Call for antibiotic if yellow or green phlegm  Repeat scan in August Ok to take xanax for anxiety

## 2013-06-21 NOTE — Progress Notes (Signed)
   Subjective:    Patient ID: Aaron [name], male    DOB: 11-09-1950, 63 y.o.   MRN: [id number]  HPI  62/M, ex- smoker, quit '10 for FU of pulm nodules & COPD Gold stg 3 -FEV1 31%  First noted in bilat upper lobes in 6/09, but new nodules keep appearing & resolving on serial CTs. On CT in march'12 appear to be inflammatory & scarring.  On xanax for anxiety.  Has 3-4 flares/ yr  2009 - FEV1 31%,DLCO 49%, high TLC c/w emphysema  08/2010 -daliresp caused diarrhea  02/16/12 Spirometry - fev1 19% -lower from 4 y ago   06/21/2013  Chief Complaint  Patient presents with  . Follow-up    Pt reports breathing not doing well. Req prednisone and ABX. Dry cough, wheezing, lots of chest tx. friday coughed up green. C/o chest cong     CT chest on April 08/2013 compared to dec 2013  that showed 3 new nodules in the left lower lobe. Largest, of which is 1.5 cm. Air space consolidation of the left upper lobe. Along with emphysematous changes.  >> Got levaquin + pred last OV on 05/30/13 [facility] & dizziness, benicar & bisoprolol stopped, Hb 9.9 - GI evaln pending Anxiety controlled  on xanax    Review of Systems neg for any significant sore throat, dysphagia, itching, sneezing, nasal congestion or excess/ purulent secretions, fever, chills, sweats, unintended wt loss, pleuritic or exertional cp, hempoptysis, orthopnea pnd or change in chronic leg swelling. Also denies presyncope, palpitations, heartburn, abdominal pain, nausea, vomiting, diarrhea or change in bowel or urinary habits, dysuria,hematuria, rash, arthralgias, visual complaints, headache, numbness weakness or ataxia.     Objective:   Physical Exam  Gen. Pleasant, well-nourished, in no distress, on O2 ENT - no lesions, no post nasal drip Neck: No JVD, no thyromegaly, no carotid bruits Lungs: no use of accessory muscles, no dullness to percussion, clear without rales or rhonchi  Cardiovascular: Rhythm  regular, heart sounds  normal, no murmurs or gallops, no peripheral edema Musculoskeletal: No deformities, no cyanosis or clubbing         Assessment & Plan:

## 2013-07-14 ENCOUNTER — Telehealth: Payer: Self-pay | Admitting: Pulmonary Disease

## 2013-07-14 NOTE — Telephone Encounter (Signed)
Called and spoke with the pt and per the last refill of the xanax----pt will have to get further refills from PCP.  Called and advised the pt of this and he will call to see if they will refill medication for him.

## 2013-07-20 ENCOUNTER — Other Ambulatory Visit: Payer: Self-pay | Admitting: Physician Assistant

## 2013-08-19 ENCOUNTER — Other Ambulatory Visit: Payer: Self-pay | Admitting: Pulmonary Disease

## 2013-09-19 ENCOUNTER — Telehealth: Payer: Self-pay | Admitting: Pulmonary Disease

## 2013-09-19 MED ORDER — TIOTROPIUM BROMIDE MONOHYDRATE 18 MCG IN CAPS: ORAL_CAPSULE | RESPIRATORY_TRACT | Status: DC

## 2013-09-19 MED ORDER — FLUTICASONE-SALMETEROL 250-50 MCG/DOSE IN AEPB: INHALATION_SPRAY | RESPIRATORY_TRACT | Status: DC

## 2013-09-19 NOTE — Telephone Encounter (Signed)
Pt  Is scheduled to see RA 09/26/13 at 4:30. Nothing further needed

## 2013-09-22 ENCOUNTER — Telehealth: Payer: Self-pay | Admitting: Cardiovascular Disease

## 2013-09-22 NOTE — Telephone Encounter (Signed)
TRYING TO reach patient in reference to getting Echo done before Aug 18th visit with Dr Marilynne DriversKoneswaran Aaron [name] is it still needed and if so is there an order in

## 2013-09-23 NOTE — Telephone Encounter (Signed)
Left message to return call 

## 2013-09-26 ENCOUNTER — Ambulatory Visit: Payer: Medicare HMO | Admitting: Pulmonary Disease

## 2013-09-26 ENCOUNTER — Ambulatory Visit (INDEPENDENT_AMBULATORY_CARE_PROVIDER_SITE_OTHER)
Admission: RE | Admit: 2013-09-26 | Discharge: 2013-09-26 | Disposition: A | Discharge status: Home / Self Care | Payer: Medicare HMO | Source: Ambulatory Visit | Attending: Pulmonary Disease | Admitting: Pulmonary Disease

## 2013-09-26 DIAGNOSIS — J984 Other disorders of lung: Secondary | ICD-10-CM

## 2013-10-03 NOTE — Telephone Encounter (Signed)
Explained to patient that recall was for AAA.  Can take care of this tomorrow during OV with Dr. Purvis SheffieldKoneswaran.  Patient verbalized understanding.

## 2013-10-04 ENCOUNTER — Ambulatory Visit: Payer: Medicare HMO | Admitting: Cardiovascular Disease

## 2013-10-04 ENCOUNTER — Ambulatory Visit (INDEPENDENT_AMBULATORY_CARE_PROVIDER_SITE_OTHER): Payer: Medicare HMO | Admitting: Cardiovascular Disease

## 2013-10-04 ENCOUNTER — Encounter: Payer: Self-pay | Admitting: Cardiovascular Disease

## 2013-10-04 ENCOUNTER — Encounter: Payer: Self-pay | Admitting: *Deleted

## 2013-10-04 VITALS — BP 164/92 | HR 90 | Ht 64.0 in | Wt 103.0 lb

## 2013-10-04 DIAGNOSIS — I10 Essential (primary) hypertension: Secondary | ICD-10-CM

## 2013-10-04 DIAGNOSIS — I739 Peripheral vascular disease, unspecified: Secondary | ICD-10-CM

## 2013-10-04 DIAGNOSIS — I2589 Other forms of chronic ischemic heart disease: Secondary | ICD-10-CM

## 2013-10-04 DIAGNOSIS — F17200 Nicotine dependence, unspecified, uncomplicated: Secondary | ICD-10-CM

## 2013-10-04 DIAGNOSIS — I714 Abdominal aortic aneurysm, without rupture, unspecified: Secondary | ICD-10-CM

## 2013-10-04 DIAGNOSIS — E785 Hyperlipidemia, unspecified: Secondary | ICD-10-CM

## 2013-10-04 DIAGNOSIS — J438 Other emphysema: Secondary | ICD-10-CM

## 2013-10-04 DIAGNOSIS — I255 Ischemic cardiomyopathy: Secondary | ICD-10-CM

## 2013-10-04 DIAGNOSIS — I2510 Atherosclerotic heart disease of native coronary artery without angina pectoris: Secondary | ICD-10-CM

## 2013-10-04 NOTE — Patient Instructions (Signed)
Your physician has requested that you have an abdominal aorta duplex. During this test, an ultrasound is used to evaluate the aorta. Allow 30 minutes for this exam. Do not eat after midnight the day before and avoid carbonated beverages Office will contact with results via phone or letter.   Your physician has requested that you regularly monitor and record your blood pressure readings at home. Please take at varied times of the day 4-5 x per week & bring readings back to office around mid September for MD review. Continue all current medications. Your physician wants you to follow up in: 6 months.  You will receive a reminder letter in the mail one-two months in advance.  If you don't receive a letter, please call our office to schedule the follow up appointment

## 2013-10-04 NOTE — Progress Notes (Signed)
Patient ID: Aaron [name], male   DOB: 09-Jul-1950, 63 y.o.   MRN: [id number]      SUBJECTIVE: The patient presents for routine cardiovascular followup. In summary, he has a history of coronary artery disease, ischemic cardiomyopathy, essential hypertension, hyperlipidemia, and an abdominal aortic aneurysm. He also has severe bullous emphysema with consequent severe COPD, and follows with Dr. Vassie LollAlva (pulmonary).  He is doing well overall and denies chest discomfort, and when he does have any chest-related symptoms it appears to be due to his pulmonary disease. He shortness of breath is stable. He denies palpitations and leg swelling.  He apparently became hypotensive and sustained a syncopal episode in April of this year and was hospitalized [facility]. I do not have those records presently available. Ultimately all his antihypertensive medications including Benicar and bisoprolol were stopped. He has been monitoring his blood pressure at home with readings from 120-137/80-85. It was elevated in the office today at 164/92 but he admits to being a bit anxious and stressed when he first came in.   Review of Systems: As per "subjective", otherwise negative.  No Known Allergies  Current Outpatient Prescriptions  Medication Sig Dispense Refill  . albuterol (PROVENTIL) (2.5 MG/3ML) 0.083% nebulizer solution USE ONE VIAL IN NEBULIZER FOUR TIMES DAILY      . ALPRAZolam (XANAX) 0.5 MG tablet TAKE ONE TABLET BY MOUTH EVERY TWELVE HOURS AS NEEDED  60 tablet  0  . aspirin 81 MG tablet Take 81 mg by mouth daily.      Marland Kitchen. atorvastatin (LIPITOR) 20 MG tablet TAKE 1 TABLET BY MOUTH DAILY  90 tablet  0  . clopidogrel (PLAVIX) 75 MG tablet TAKE ONE TABLET BY MOUTH ONCE DAILY  30 tablet  6  . Fluticasone-Salmeterol (ADVAIR DISKUS) 250-50 MCG/DOSE AEPB INHALE 1 PUFF EVERY 12 HOURS. (RINSE, GARGLE AND SPIT AFTER EACH USE)  60 each  2  . nitroGLYCERIN (NITROSTAT) 0.4 MG SL tablet Place 1 tablet (0.4 mg  total) under the tongue every 5 (five) minutes as needed.  25 tablet  0  . PROAIR HFA 108 (90 BASE) MCG/ACT inhaler INHALE TWO PUFFS EVERY 6 HOURS AS NEEDED FOR WHEEZING  8.5 g  4  . tiotropium (SPIRIVA HANDIHALER) 18 MCG inhalation capsule INHALE CONTENTS OF 1 CAPSULE ONCE DAILY  30 capsule  2   No current facility-administered medications for this visit.    Past Medical History  Diagnosis Date  . COPD (chronic obstructive pulmonary disease)     severe; bullos emphysema. longstanding tobacco smoking   . Tobacco abuse   . Pulmonary nodule   . History of heart attack   . Hypertension   . Asthma   . PVD (peripheral vascular disease)     Fusiform distal AAA, with moderate thrombus (3.7 cm x 3.2 cm x 5.1 cm long); moderate aortoiliac atherosclerosis, 05/2012;LLE claudication with 0.90 ABI; 0.94 on rt, 06/2007  . CAD (coronary artery disease)     severe single-vessel coronary arteries w/mildly reduced EF 45% sginif. for her wall motion abnml. chronically occluded rt coronary artery.   . Ischemic cardiomyopathy     Dobutamine stress CL, 09/2011: No large areas of ischemia; EF 59%; severe; EF 40-45% w/inferior apical akenesis by 2D echo 4/09. EF 30-35% by cardiac cath in '04. s/p MSTEMI/bare metal stenting of prximal LAD 10/04. residual, chronic 100% distal RCA stenosis. s/p MI/PCI  in [id number]; HRSA/stenting in 1996 [facility]  . VT (ventricular tachycardia)     hx of nonsustained; noninducible  electophysiology study in 2004  . Orthostatic hypotension     hx  . Dyslipidemia     statin intolerant    Past Surgical History  Procedure Laterality Date  . Angioplasty  2004    History   Social History  . Marital Status: Divorced    Spouse Name: N/A    Number of Children: N/A  . 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 Smoking status: Former Smoker -- 1.00 packs/day for 48 years    Types: Cigarettes    Start date: 12/06/1960     Quit date: 02/18/2008  . Smokeless tobacco: Never Used  . Alcohol Use: 1.1 oz/week    1 Cans of beer, 1 Drinks containing 0.5 oz of alcohol per week  . Drug Use: No  . Sexual Activity: Not on file   Other Topics Concern  . Not on file   Social History Narrative  . No narrative on file     Filed Vitals:   10/04/13 1316  BP: 164/92  Pulse: 90  Height: 5\' 4"  (1.626 m)  Weight: 103 lb (46.72 kg)  SpO2: 100%    PHYSICAL EXAM General: NAD, thin cachectic build, oxygen by nasal cannula Neck: No JVD, no thyromegaly or thyroid nodule.  Lungs: Diminished air entry b/l, with faint end-expiratory wheezes, no crackles.  CV: Nondisplaced PMI. Distant heart sounds, regular S1/S2, no S3/S4, no murmur. No peripheral edema. No carotid bruit.   Abdomen: Soft, nontender, no hepatosplenomegaly, no distention.  Neurologic: Alert and oriented x 3.  Skin: Normal. Musculoskeletal: Normal ROM. Psych: Normal affect.  Extremities: No clubbing or cyanosis.   ECG: Most recent ECG reviewed.    ASSESSMENT AND PLAN:  CAD/Ischemic cardiomyopathy  Stable on current medication regimen, which includes lifelong DAPT. His most recent assessment of LVEF was by nuclear imaging in August 2013, calculated at 59%.   Essential hypertension  Uncontrolled today off of antihypertensive therapy, but likely related to anxiety/stress, as readings have been normal at home. I have asked the patient to check blood pressure readings 4-5 times per week, at different times throughout the day, in order to get a better approximation of mean BP values. These results will be provided to me at the end of that period so that I can determine if antihypertensive medication titration is indicated.  AAA  Will schedule surveillance ultrasound to assess diameter.  Hyperlipidemia  Lipids managed by PCP. Continues on Lipitor 20 mg daily.   COPD Follows with pulmonary. Takes Advair, Proair prn and albuterol nebs.  Dispo: f/u 6  months.   Prentice Docker, M.D., F.A.C.C.

## 2013-10-20 ENCOUNTER — Other Ambulatory Visit: Payer: Self-pay | Admitting: Cardiovascular Disease

## 2013-10-25 ENCOUNTER — Other Ambulatory Visit: Payer: Self-pay | Admitting: Cardiovascular Disease

## 2013-10-25 ENCOUNTER — Telehealth: Payer: Self-pay | Admitting: Pulmonary Disease

## 2013-10-25 MED ORDER — PREDNISONE 10 MG PO TABS: ORAL_TABLET | ORAL | Status: DC

## 2013-10-25 NOTE — Telephone Encounter (Signed)
Called spoke with pt. He c/o increase SOB w/ exertion, chest congestion, prod cough-clear phlem, wheezing x couple days. Pt wants prednisone called in. Pt lives in [place] so it is ahrd for him to come in. RA on vacation. Pt has pending appt 11/07/13 w/ RA. Please advise TP thanks  No Known Allergies   Current Outpatient Prescriptions on File Prior to Visit  Medication Sig Dispense Refill  . albuterol (PROVENTIL) (2.5 MG/3ML) 0.083% nebulizer solution USE ONE VIAL IN NEBULIZER FOUR TIMES DAILY      . ALPRAZolam (XANAX) 0.5 MG tablet TAKE ONE TABLET BY MOUTH EVERY TWELVE HOURS AS NEEDED  60 tablet  0  . aspirin 81 MG tablet Take 81 mg by mouth daily.      Marland Kitchen atorvastatin (LIPITOR) 20 MG tablet TAKE 1 TABLET BY MOUTH DAILY  90 tablet  3  . clopidogrel (PLAVIX) 75 MG tablet TAKE 1 TABLET BY MOUTH DAILY  30 tablet  6  . Fluticasone-Salmeterol (ADVAIR DISKUS) 250-50 MCG/DOSE AEPB INHALE 1 PUFF EVERY 12 HOURS. (RINSE, GARGLE AND SPIT AFTER EACH USE)  60 each  2  . nitroGLYCERIN (NITROSTAT) 0.4 MG SL tablet Place 1 tablet (0.4 mg total) under the tongue every 5 (five) minutes as needed.  25 tablet  0  . PROAIR HFA 108 (90 BASE) MCG/ACT inhaler INHALE TWO PUFFS EVERY 6 HOURS AS NEEDED FOR WHEEZING  8.5 g  4  . tiotropium (SPIRIVA HANDIHALER) 18 MCG inhalation capsule INHALE CONTENTS OF 1 CAPSULE ONCE DAILY  30 capsule  2   No current facility-administered medications on file prior to visit.

## 2013-10-25 NOTE — Telephone Encounter (Signed)
Pt aware of recs. RX called in. Nothing further needed 

## 2013-10-25 NOTE — Telephone Encounter (Signed)
Prednisone taper over next week Prednisone  4 tabs for 2 days, then 3 tabs for 2 days, 2 tabs for 2 days, then 1 tab for 2 days, then stop #20  Mucinex DM Twice daily  As needed  Cough/congestion  Follow up as planned and As needed   Please contact office for sooner follow up if symptoms do not improve or worsen or seek emergency care

## 2013-11-02 ENCOUNTER — Ambulatory Visit (INDEPENDENT_AMBULATORY_CARE_PROVIDER_SITE_OTHER): Payer: Medicare HMO | Admitting: Cardiology

## 2013-11-02 DIAGNOSIS — I714 Abdominal aortic aneurysm, without rupture, unspecified: Secondary | ICD-10-CM

## 2013-11-02 NOTE — Progress Notes (Signed)
Abdominal aorta Duplex performed. 

## 2013-11-07 ENCOUNTER — Encounter (INDEPENDENT_AMBULATORY_CARE_PROVIDER_SITE_OTHER): Payer: Self-pay

## 2013-11-07 ENCOUNTER — Ambulatory Visit (INDEPENDENT_AMBULATORY_CARE_PROVIDER_SITE_OTHER): Payer: Medicare HMO | Admitting: Pulmonary Disease

## 2013-11-07 ENCOUNTER — Encounter: Payer: Self-pay | Admitting: Pulmonary Disease

## 2013-11-07 VITALS — BP 130/70 | HR 75 | Temp 96.8°F | Ht 64.0 in | Wt 103.0 lb

## 2013-11-07 DIAGNOSIS — J441 Chronic obstructive pulmonary disease with (acute) exacerbation: Secondary | ICD-10-CM

## 2013-11-07 DIAGNOSIS — J984 Other disorders of lung: Secondary | ICD-10-CM

## 2013-11-07 DIAGNOSIS — Z23 Encounter for immunization: Secondary | ICD-10-CM

## 2013-11-07 MED ORDER — PREDNISONE 10 MG PO TABS: 10.0000 mg | ORAL_TABLET | Freq: Every day | ORAL | Status: DC

## 2013-11-07 NOTE — Patient Instructions (Signed)
FLu shot Prednisone 10 mg tabs  Take 2 tabs daily with food x 10ds, then 1 tab daily with food x 10ds then  1/2 tab daily x 10 days then STOP Take mucinex daily Albuterol nebs daily  Stay on advair/ spiriva CT chest in aug 2016

## 2013-11-07 NOTE — Assessment & Plan Note (Signed)
CT chest in aug 2016

## 2013-11-07 NOTE — Assessment & Plan Note (Signed)
FLu shot Prednisone 10 mg tabs  Take 2 tabs daily with food x 10ds, then 1 tab daily with food x 10ds then  1/2 tab daily x 10 days then STOP Take mucinex daily Albuterol nebs daily  Stay on advair/ spiriva

## 2013-11-07 NOTE — Progress Notes (Signed)
   Subjective:    Patient ID: Aaron [name], male    DOB: [birth year], 63 y.o.   MRN: [id number]  HPI  62/M, ex- smoker, quit '10 for FU of pulm nodules & COPD Gold stg 3 -FEV1 31%  First noted in bilat upper lobes in 6/09, but new nodules keep appearing & resolving on serial CTs. On CT in march'12 appear to be inflammatory & scarring.  On xanax for anxiety.  Has 3-4 flares/ yr  2009 - FEV1 31%,DLCO 49%, high TLC c/w emphysema  08/2010 -daliresp caused diarrhea  02/16/12 Spirometry - fev1 19% -lower from 4 y ago   11/07/2013  Chief Complaint  Patient presents with  . Follow-up    Discuss CT resutls. Pt c/o incr SOB and rattle in chest. Pt reports that he has completed course of Prednisone given 10/25/13. Pt reports little impovement .    Called for sob 9/8 >> pred taper called in Using nebs 4 /d CT chest  09/2013  - decrease in size of the the largest left lower lobe pulmonary nodule, decrease in consolidative pattern in the left upper lobe.   CT chest on April 08/2013 compared to dec 2013 that showed 3 new nodules in the left lower lobe. Largest, of which is 1.5 cm. Air space consolidation of the left upper lobe Anxiety controlled on xanax    Review of Systems neg for any significant sore throat, dysphagia, itching, sneezing, nasal congestion or excess/ purulent secretions, fever, chills, sweats, unintended wt loss, pleuritic or exertional cp, hempoptysis, orthopnea pnd or change in chronic leg swelling. Also denies presyncope, palpitations, heartburn, abdominal pain, nausea, vomiting, diarrhea or change in bowel or urinary habits, dysuria,hematuria, rash, arthralgias, visual complaints, headache, numbness weakness or ataxia.     Objective:   Physical Exam  Gen. Pleasant, well-nourished, in no distress, normal affect ENT - no lesions, no post nasal drip Neck: No JVD, no thyromegaly, no carotid bruits Lungs: no use of accessory muscles, no dullness to percussion, decreased with  BL  rhonchi  Cardiovascular: Rhythm regular, heart sounds  normal, no murmurs or gallops, no peripheral edema Abdomen: soft and non-tender, no hepatosplenomegaly, BS normal. Musculoskeletal: No deformities, no cyanosis or clubbing Neuro:  alert, non focal        Assessment & Plan:

## 2013-11-08 ENCOUNTER — Telehealth: Payer: Self-pay | Admitting: *Deleted

## 2013-11-08 DIAGNOSIS — I714 Abdominal aortic aneurysm, without rupture, unspecified: Secondary | ICD-10-CM

## 2013-11-08 NOTE — Telephone Encounter (Signed)
Increase in aneursym size.   ----- Message ----- From: Lesle Chris, LPN Sent: 1/61/0960 11:29 AM To: Laqueta Linden, MD

## 2013-11-08 NOTE — Telephone Encounter (Signed)
Notes Recorded by Lesle Chris, LPN on 0/98/1191 at 3:53 PM Patient notified. Will put referral in & notify [facility] ([facility]).  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  Notes Recorded by Laqueta Linden, MD on 11/04/2013 at 8:31 AM Please make a referral to vascular surgery.

## 2013-11-10 ENCOUNTER — Encounter: Payer: Medicare HMO | Admitting: Vascular Surgery

## 2013-11-25 ENCOUNTER — Encounter: Payer: Self-pay | Admitting: Surgery

## 2013-11-28 ENCOUNTER — Encounter: Payer: Self-pay | Admitting: Surgery

## 2013-11-28 ENCOUNTER — Ambulatory Visit (INDEPENDENT_AMBULATORY_CARE_PROVIDER_SITE_OTHER): Payer: Medicare HMO | Admitting: Surgery

## 2013-11-28 VITALS — BP 164/79 | HR 72 | Ht 64.0 in | Wt 107.0 lb

## 2013-11-28 DIAGNOSIS — I714 Abdominal aortic aneurysm, without rupture, unspecified: Secondary | ICD-10-CM

## 2013-11-28 DIAGNOSIS — I771 Stricture of artery: Secondary | ICD-10-CM

## 2013-11-28 NOTE — Progress Notes (Signed)
Patient name: Aaron [name] J Delao MRN: [id number] DOB: 03/06/1950 Sex: male   Referred by: Dr. Purvis SheffieldKoneswaran  Reason for referral:  Chief Complaint  Patient presents with  . New Evaluation    AAA and L external iliac stenosis    HISTORY OF PRESENT ILLNESS: This is a 63 year old gentleman who is referred today for evaluation of an abdominal aortic aneurysm.  This has been followed with ultrasound, and recently eclipsed to the 4 cm measurement.  He denies any abdominal pain or back pain.  The patient has a history of coronary artery disease.  He has had a total of 4 heart attacks.  This was treated percutaneously at [facility] in the 1980s.  He also underwent stenting approximately 10 years ago at [place].  He currently is maintained on antiplatelet therapy with aspirin and Plavix.  The patient has a significant pulmonary history secondary to smoking.  He continues to smoke approximately a pack every day.  He is on 2 L of continuous oxygen.  His cholesterol is managed with a statin.  He has been treated for hypertension in the past, however earlier this year he had a syncopal episode and was found to be hypotensive, and his blood pressure medicine was discontinued.  He checks his blood pressure weekly and states it usually runs about 120/85.  The patient has a history of peripheral vascular disease, but denies having claudication type symptoms.  Specifically, he does not have cramping in his legs with activity.  Activity is limited by his shortness of breath.  Past Medical History  Diagnosis Date  . COPD (chronic obstructive pulmonary disease)     severe; bullos emphysema. longstanding tobacco smoking   . Tobacco abuse   . Pulmonary nodule   . History of heart attack   . Hypertension   . Asthma   . PVD (peripheral vascular disease)     Fusiform distal AAA, with moderate thrombus (3.7 cm x 3.2 cm x 5.1 cm long); moderate aortoiliac atherosclerosis, 05/2012;LLE claudication with  0.90 ABI; 0.94 on rt, 06/2007  . CAD (coronary artery disease)     severe single-vessel coronary arteries w/mildly reduced EF 45% sginif. for her wall motion abnml. chronically occluded rt coronary artery.   . Ischemic cardiomyopathy     Dobutamine stress CL, 09/2011: No large areas of ischemia; EF 59%; severe; EF 40-45% w/inferior apical akenesis by 2D echo 4/09. EF 30-35% by cardiac cath in '04. s/p MSTEMI/bare metal stenting of prximal LAD 10/04. residual, chronic 100% distal RCA stenosis. s/p MI/PCI  in [id number]; HRSA/stenting in 1996 [facility]  . VT (ventricular tachycardia)     hx of nonsustained; noninducible electophysiology study in 2004  . Orthostatic hypotension     hx  . Dyslipidemia     statin intolerant    Past Surgical History  Procedure Laterality Date  . Angioplasty  2004  . Coronary stent placement      History   Social History  . Marital Status: Divorced    Spouse Name: N/A    Number of Children: N/A  . Years of Education: N/A   Occupational History  . disabled     Education administratorpainter   Social History Main Topics  . Smoking status: Current Every Day Smoker -- 1.00 packs/day for 48 years    Types: Cigarettes    Start date: 12/06/1960    Last Attempt to Quit: 02/18/2008  . Smokeless tobacco: Never Used  . Alcohol Use: 1.1 oz/week    1  Cans of beer, 1 Drinks containing 0.5 oz of alcohol per week  . Drug Use: No  . Sexual Activity: Not on file   Other Topics Concern  . Not on file   Social History Narrative  . No narrative on file    Family History  Problem Relation Age of Onset  . Emphysema Brother   . Hypertension Brother   . Lung cancer Father     Allergies as of 11/28/2013  . (No Known Allergies)    Current Outpatient Prescriptions on File Prior to Visit  Medication Sig Dispense Refill  . albuterol (PROVENTIL) (2.5 MG/3ML) 0.083% nebulizer solution USE ONE VIAL IN NEBULIZER FOUR TIMES DAILY      . ALPRAZolam (XANAX) 0.5 MG tablet TAKE ONE TABLET BY MOUTH  EVERY TWELVE HOURS AS NEEDED  60 tablet  0  . aspirin 81 MG tablet Take 81 mg by mouth daily.      Marland Kitchen. atorvastatin (LIPITOR) 20 MG tablet TAKE 1 TABLET BY MOUTH DAILY  90 tablet  3  . clopidogrel (PLAVIX) 75 MG tablet TAKE 1 TABLET BY MOUTH DAILY  30 tablet  6  . Fluticasone-Salmeterol (ADVAIR DISKUS) 250-50 MCG/DOSE AEPB INHALE 1 PUFF EVERY 12 HOURS. (RINSE, GARGLE AND SPIT AFTER EACH USE)  60 each  2  . nitroGLYCERIN (NITROSTAT) 0.4 MG SL tablet Place 1 tablet (0.4 mg total) under the tongue every 5 (five) minutes as needed.  25 tablet  0  . predniSONE (DELTASONE) 10 MG tablet Take 1 tablet (10 mg total) by mouth daily with breakfast.  35 tablet  0  . PROAIR HFA 108 (90 BASE) MCG/ACT inhaler INHALE TWO PUFFS EVERY 6 HOURS AS NEEDED FOR WHEEZING  8.5 g  4  . tiotropium (SPIRIVA HANDIHALER) 18 MCG inhalation capsule INHALE CONTENTS OF 1 CAPSULE ONCE DAILY  30 capsule  2   No current facility-administered medications on file prior to visit.     REVIEW OF SYSTEMS: Cardiovascular: No chest pain, chest pressure, palpitations, orthopnea, or dyspnea on exertion. No claudication or rest pain,  No history of DVT or phlebitis. Pulmonary: Positive for shortness of breath, on home oxygen. Neurologic: No weakness, paresthesias, aphasia, or amaurosis. No dizziness. Hematologic: No bleeding problems or clotting disorders. Musculoskeletal: No joint pain or joint swelling. Gastrointestinal: No blood in stool or hematemesis Genitourinary: No dysuria or hematuria. Psychiatric:: No history of major depression. Integumentary: No rashes or ulcers. Constitutional: No fever or chills.  PHYSICAL EXAMINATION: General: The patient appears their stated age.  Vital signs are BP 164/79  Pulse 72  Ht 5\' 4"  (1.626 m)  Wt 107 lb (48.535 kg)  BMI 18.36 kg/m2  SpO2 100% HEENT:  No gross abnormalities Pulmonary: Respirations are non-labored Abdomen: Soft and non-tender  Musculoskeletal: There are no major  deformities.   Neurologic: No focal weakness or paresthesias are detected, Skin: There are no ulcer or rashes noted. Psychiatric: The patient has normal affect. Cardiovascular: There is a regular rate and rhythm without significant murmur appreciated.  Palpable femoral pulses.  I cannot palpate pedal pulses.  No carotid bruits  Diagnostic Studies: I have reviewed his outside ultrasound which shows an abdominal aortic aneurysm with maximum diameter of 4 cm.  He also has greater than 50% stenosis of his left external iliac artery.    Assessment:  #1: Abdominal aortic aneurysm #2: Peripheral vascular disease with claudication, left leg Plan: #1: The patient's maximum aortic aneurysm 4 cm.  We discussed the signs and symptoms of rupture, as  well as the indications for repair.  I have recommended repeat his ultrasound in 6 months, which will be done in our office followed by a clinic visit. #2: The patient has external iliac stenosis on ultrasound as well as nonpalpable pedal pulses.  He currently does not endorse symptoms of claudication, and therefore this will be monitored for symptoms.  There is no indication for intervention currently.     Jorge Ny, M.D. [facility] [place] Office: [phone] Pager:  [phone]

## 2013-11-28 NOTE — Addendum Note (Signed)
Addended by: Sharee PimpleMCCHESNEY, MARILYN K on: 11/28/2013 05:40 PM   Modules accepted: Orders

## 2013-12-07 ENCOUNTER — Ambulatory Visit: Payer: Medicare HMO | Admitting: Adult Health

## 2013-12-16 ENCOUNTER — Encounter: Payer: Self-pay | Admitting: Adult Health

## 2013-12-16 ENCOUNTER — Ambulatory Visit (INDEPENDENT_AMBULATORY_CARE_PROVIDER_SITE_OTHER): Payer: Medicare HMO | Admitting: Adult Health

## 2013-12-16 VITALS — BP 126/84 | HR 81 | Temp 97.4°F | Ht 64.0 in | Wt 106.2 lb

## 2013-12-16 DIAGNOSIS — J449 Chronic obstructive pulmonary disease, unspecified: Secondary | ICD-10-CM

## 2013-12-16 DIAGNOSIS — Z23 Encounter for immunization: Secondary | ICD-10-CM

## 2013-12-16 NOTE — Addendum Note (Signed)
Addended by: York RamGAY, Kanton Kamel on: 12/16/2013 03:08 PM   Modules accepted: Orders

## 2013-12-16 NOTE — Progress Notes (Signed)
   Subjective:    Patient ID: Aaron [name], male    DOB: 1951/02/08, 63 y.o.   MRN: [id number]  HPI   62/M, ex- smoker, quit '10 for FU of pulm nodules & COPD Gold stg 3 -FEV1 31%  First noted in bilat upper lobes in 6/09, but new nodules keep appearing & resolving on serial CTs. On CT in march'12 appear to be inflammatory & scarring.  On xanax for anxiety.  Has 3-4 flares/ yr  2009 - FEV1 31%,DLCO 49%, high TLC c/w emphysema  08/2010 -daliresp caused diarrhea  02/16/12 Spirometry - fev1 19% -lower from 4 y ago   9/03/20/13  Called for sob 9/8 >> pred taper called in Using nebs 4 /d CT chest  09/2013  - decrease in size of the the largest left lower lobe pulmonary nodule, decrease in consolidative pattern in the left upper lobe.   CT chest on April 08/2013 compared to dec 2013 that showed 3 new nodules in the left lower lobe. Largest, of which is 1.5 cm. Air space consolidation of the left upper lobe Anxiety controlled on xanax  12/16/2013 Follow up  Patient returns for a one-month follow-up Last visit with a COPD exacerbation treated with a steroid taper.  Reports the prednisone minimally improved symptoms.   Has on/off cough and congestion  producing brownish mucus. No fever, chest pain, orthopnea , edema , or hemoptysis.  Still smoking , cessation discussed.   Review of Systems  neg for any significant sore throat, dysphagia, itching, sneezing, nasal congestion or excess/ purulent secretions, fever, chills, sweats, unintended wt loss, pleuritic or exertional cp, hempoptysis, orthopnea pnd or change in chronic leg swelling. Also denies presyncope, palpitations, heartburn, abdominal pain, nausea, vomiting, diarrhea or change in bowel or urinary habits, dysuria,hematuria, rash, arthralgias, visual complaints, headache, numbness weakness or ataxia.     Objective:   Physical Exam   Gen. Pleasant, well-nourished, in no distress, normal affect ENT - no lesions, no post nasal  drip Neck: No JVD, no thyromegaly, no carotid bruits Lungs: no use of accessory muscles, no dullness to percussion, decreased with BL  rhonchi  Cardiovascular: Rhythm regular, heart sounds  normal, no murmurs or gallops, no peripheral edema Abdomen: soft and non-tender, no hepatosplenomegaly, BS normal. Musculoskeletal: No deformities, no cyanosis or clubbing Neuro:  alert, non focal        Assessment & Plan:

## 2013-12-16 NOTE — Patient Instructions (Signed)
Continue on Advair 1 twice daily. Continue on Spiriva daily. Continued work on stopping smoking. Prevnar vaccine today Follow with Dr. Vassie LollAlva in 3 months and as needed

## 2013-12-16 NOTE — Assessment & Plan Note (Signed)
Recent exacerbation, now resolved. Patient encouraged on smoking cessation  Plan  Continue on Advair 1 twice daily. Continue on Spiriva daily. Continued work on stopping smoking. Prevnar vaccine today Follow with Dr. Vassie LollAlva in 3 months and as needed

## 2013-12-19 NOTE — Progress Notes (Signed)
Reviewed & agree with plan  

## 2014-01-03 ENCOUNTER — Telehealth: Payer: Self-pay | Admitting: Pulmonary Disease

## 2014-01-03 MED ORDER — AZITHROMYCIN 250 MG PO TABS: ORAL_TABLET | ORAL | Status: DC

## 2014-01-03 MED ORDER — PREDNISONE 10 MG PO TABS: ORAL_TABLET | ORAL | Status: DC

## 2014-01-03 NOTE — Telephone Encounter (Signed)
Last OV 12-16-13. Pt is c/o having increased productive cough with brown phlegm, chest congestion, chest tightness, and wheezing x 1 week. Pt denies any increase in SOB, fever, N/V, body aches or chills. Pt cannot come in for OV today due to transportation. Please advise. Carron CurieJennifer Thurley Francesconi, CMA No Known Allergies

## 2014-01-03 NOTE — Telephone Encounter (Signed)
zpak Prednisone 10 mg tabs  Take 2 tabs daily with food x 5ds, then 1 tab daily with food x 5ds then STOP  

## 2014-01-03 NOTE — Telephone Encounter (Signed)
Called and spoke to pt. Informed pt of the recs per RA. Rx sent to preferred pharmacy. Pt verbalized understanding and denied any further questions or concerns at this time.   

## 2014-02-11 ENCOUNTER — Telehealth: Payer: Self-pay | Admitting: Internal Medicine

## 2014-02-11 MED ORDER — PREDNISONE 10 MG PO TABS: ORAL_TABLET | ORAL | Status: DC

## 2014-02-11 MED ORDER — AZITHROMYCIN 250 MG PO TABS: ORAL_TABLET | ORAL | Status: DC

## 2014-02-11 NOTE — Telephone Encounter (Signed)
  Feels "bronchitis coming back again, can you call me what Dr Vassie LollAlva did, it always works: zpak Prednisone 10 mg take  4 each am x 2 days,   2 each am x 2 days,  1 each am x 2 days and stop  done

## 2014-02-16 ENCOUNTER — Other Ambulatory Visit: Payer: Self-pay | Admitting: Pulmonary Disease

## 2014-03-03 ENCOUNTER — Encounter: Payer: Self-pay | Admitting: Pulmonary Disease

## 2014-03-03 ENCOUNTER — Ambulatory Visit (INDEPENDENT_AMBULATORY_CARE_PROVIDER_SITE_OTHER): Payer: Medicare HMO | Admitting: Pulmonary Disease

## 2014-03-03 VITALS — BP 130/70 | HR 85 | Temp 97.8°F | Ht 64.0 in | Wt 106.2 lb

## 2014-03-03 DIAGNOSIS — J441 Chronic obstructive pulmonary disease with (acute) exacerbation: Secondary | ICD-10-CM

## 2014-03-03 DIAGNOSIS — J449 Chronic obstructive pulmonary disease, unspecified: Secondary | ICD-10-CM

## 2014-03-03 MED ORDER — ALBUTEROL SULFATE 108 (90 BASE) MCG/ACT IN AEPB: 1.0000 | INHALATION_SPRAY | RESPIRATORY_TRACT | Status: AC | PRN

## 2014-03-03 NOTE — Assessment & Plan Note (Signed)
COPD is stable Call if persistent green phlegm Stay on advair & spiriva Albuterol only for wheezing  

## 2014-03-03 NOTE — Assessment & Plan Note (Signed)
COPD is stable Call if persistent green phlegm Stay on advair & spiriva Albuterol only for wheezing

## 2014-03-03 NOTE — Patient Instructions (Signed)
COPD is stable Call if persistent green phlegm Stay on advair & spiriva Albuterol only for wheezing Repeat CT scan in April 2016

## 2014-03-03 NOTE — Progress Notes (Signed)
   Subjective:    Patient ID: Aaron [name], male    DOB: 1950/07/28, 64 y.o.   MRN: [id number]  HPI  63/M, ex- smoker, quit '10 for FU of pulm nodules & COPD Gold stg 3 -FEV1 31%  First noted in bilat upper lobes in 6/09, but new nodules keep appearing & resolving on serial CTs- appear to be inflammatory & scarring.  On xanax for anxiety.  Has 3-4 flares/ yr  2009 - FEV1 31%,DLCO 49%, high TLC c/w emphysema  08/2010 -daliresp caused diarrhea  02/16/12 Spirometry - fev1 19% -lower from 4 y ago   CT chest 09/2013 - decrease in size of the the largest left lower lobe pulmonary nodule, decrease in consolidative pattern in the left upper lobe.    03/03/2014  Chief Complaint  Patient presents with  . Follow-up    breathing same, constant cough, no chest tightness; no concerns   Anxiety remains, on xanax Taking albuterol 'too much' 02/11/14 >> zpak/pred called in - now better 4cm AAA being monitored by vascular  Has on/off cough and congestion producing brownish mucus. No fever, chest pain, orthopnea , edema , or hemoptysis.  Has again quit smoking 01/2014   Review of Systems neg for any significant sore throat, dysphagia, itching, sneezing, nasal congestion or excess/ purulent secretions, fever, chills, sweats, unintended wt loss, pleuritic or exertional cp, hempoptysis, orthopnea pnd or change in chronic leg swelling. Also denies presyncope, palpitations, heartburn, abdominal pain, nausea, vomiting, diarrhea or change in bowel or urinary habits, dysuria,hematuria, rash, arthralgias, visual complaints, headache, numbness weakness or ataxia.     Objective:   Physical Exam  Gen. Pleasant, well-nourished, in no distress ENT - no lesions, no post nasal drip Neck: No JVD, no thyromegaly, no carotid bruits Lungs: no use of accessory muscles, no dullness to percussion, clear without rales or rhonchi  Cardiovascular: Rhythm regular, heart sounds  normal, no murmurs or  gallops, no peripheral edema Musculoskeletal: No deformities, no cyanosis or clubbing        Assessment & Plan:

## 2014-03-03 NOTE — Assessment & Plan Note (Signed)
Repeat CT scan in April 2016

## 2014-03-13 ENCOUNTER — Telehealth: Payer: Self-pay | Admitting: Pulmonary Disease

## 2014-03-13 MED ORDER — PREDNISONE 10 MG PO TABS: ORAL_TABLET | ORAL | Status: AC

## 2014-03-13 NOTE — Telephone Encounter (Signed)
Prednisone 10 mg -Take 4 tabs  daily with food x 4 days, then 3 tabs daily x 4 days, then 2 tabs daily x 4 days, then 1 tab daily x4 days then stop. #40

## 2014-03-13 NOTE — Telephone Encounter (Signed)
Spoke with pt. Report SOB, chest tightness, wheezing and coughing x3 days. Cough is productive of white mucus. Would like prednisone called in.  No Known Allergies  RA - please advise. Thanks.

## 2014-03-13 NOTE — Telephone Encounter (Signed)
Rx sent to pharmacy, pt notified.  Nothing further needed. 

## 2014-04-18 HISTORY — PX: UPPER GI ENDOSCOPY: SHX6162

## 2014-05-19 ENCOUNTER — Other Ambulatory Visit: Payer: Self-pay | Admitting: Pulmonary Disease

## 2014-06-02 ENCOUNTER — Encounter: Payer: Self-pay | Admitting: Family

## 2014-06-05 ENCOUNTER — Ambulatory Visit (INDEPENDENT_AMBULATORY_CARE_PROVIDER_SITE_OTHER): Payer: Medicare HMO | Admitting: Family

## 2014-06-05 ENCOUNTER — Ambulatory Visit (HOSPITAL_COMMUNITY)
Admission: RE | Admit: 2014-06-05 | Discharge: 2014-06-05 | Disposition: A | Discharge status: Home / Self Care | Payer: Medicare HMO | Source: Ambulatory Visit | Attending: Family | Admitting: Family

## 2014-06-05 ENCOUNTER — Encounter: Payer: Self-pay | Admitting: Family

## 2014-06-05 ENCOUNTER — Ambulatory Visit: Payer: Medicare HMO | Admitting: Adult Health

## 2014-06-05 VITALS — BP 123/79 | HR 81 | Resp 16 | Ht 64.0 in | Wt 97.0 lb

## 2014-06-05 DIAGNOSIS — I714 Abdominal aortic aneurysm, without rupture, unspecified: Secondary | ICD-10-CM

## 2014-06-05 DIAGNOSIS — Z87891 Personal history of nicotine dependence: Secondary | ICD-10-CM | POA: Diagnosis not present

## 2014-06-05 DIAGNOSIS — I70219 Atherosclerosis of native arteries of extremities with intermittent claudication, unspecified extremity: Secondary | ICD-10-CM

## 2014-06-05 DIAGNOSIS — I739 Peripheral vascular disease, unspecified: Secondary | ICD-10-CM

## 2014-06-05 DIAGNOSIS — I771 Stricture of artery: Secondary | ICD-10-CM

## 2014-06-05 NOTE — Progress Notes (Signed)
[facility] [place]  Established Abdominal Aortic Aneurysm  History of Present Illness  Aaron [name] is a 64 y.o. (1950/02/19) male patient of Dr. Myra Gianotti who returns for surveillance of his abdominal aortic aneurysm. This has been followed with ultrasound, and recently 4 cm measurement. He denies any abdominal pain or back pain. The patient has external iliac stenosis on ultrasound.  The patient has a history of coronary artery disease. He has had a total of 4 heart attacks. This was treated percutaneously at [facility] in the 1980s. He also underwent stenting approximately 10 years ago at [place]. He currently is maintained on antiplatelet therapy with aspirin and Plavix.  The patient has a significant pulmonary history secondary to smoking. He was smoking approximately a pack every day, he quit December 2015.  He is on 2 L of continuous oxygen. His cholesterol is managed with a statin. He has been treated for hypertension in the past, however earlier this year he had a syncopal episode and was found to be hypotensive, and his blood pressure medicine was discontinued. He checks his blood pressure weekly and states it usually runs about 120/85.  Activity is limited by his shortness of breath. His pulmonologist is Dr. Vassie Loll.  His cardiologist is Dr. Morton Peters.   The patient reports weakness in calves with walking, denies non healing wounds. The patient denies history of stroke or TIA symptoms. The Plavix was stopped by Dr. Sherril Croon in 2015. He continues the daily 81 mg ASA and statin.   Pt Diabetic: No Pt smoker: former smoker, quit December 2015, started smoking about age 42, was smoking a ppd.  Past Medical History  Diagnosis Date  . COPD (chronic obstructive pulmonary disease)     severe; bullos emphysema. longstanding tobacco smoking   . Tobacco abuse   . Pulmonary nodule   . History of heart attack   . Hypertension   . Asthma   . PVD  (peripheral vascular disease)     Fusiform distal AAA, with moderate thrombus (3.7 cm x 3.2 cm x 5.1 cm long); moderate aortoiliac atherosclerosis, 05/2012;LLE claudication with 0.90 ABI; 0.94 on rt, 06/2007  . CAD (coronary artery disease)     severe single-vessel coronary arteries w/mildly reduced EF 45% sginif. for her wall motion abnml. chronically occluded rt coronary artery.   . Ischemic cardiomyopathy     Dobutamine stress CL, 09/2011: No large areas of ischemia; EF 59%; severe; EF 40-45% w/inferior apical akenesis by 2D echo 4/09. EF 30-35% by cardiac cath in '04. s/p MSTEMI/bare metal stenting of prximal LAD 10/04. residual, chronic 100% distal RCA stenosis. s/p MI/PCI  in [address]; HRSA/stenting in 1996 [facility])  . VT (ventricular tachycardia)     hx of nonsustained; noninducible electophysiology study in 2004  . Orthostatic hypotension     hx  . Dyslipidemia     statin intolerant  . Myocardial infarction     4 Heart Attack   Past Surgical History  Procedure Laterality Date  . Angioplasty  2004  . Coronary stent placement    . Upper gi endoscopy  March 2016    [facility]   Social History History   Social History  . Marital Status: Divorced    Spouse Name: N/A  . Number of Children: N/A  . 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 Smoking status: Former Smoker -- 1.00 packs/day for  48 years    Types: Cigarettes    Start date: 12/06/1960    Quit date: 02/08/2014  . Smokeless tobacco: Never Used  . Alcohol Use: 1.2 oz/week    1 Cans of beer, 1 Standard drinks or equivalent per week  . Drug Use: No  . Sexual Activity: Not on file   Other Topics Concern  . Not on file   Social History Narrative   Family History Family History  Problem Relation Age of Onset  . Emphysema Brother   . Hypertension Brother   . Lung cancer Father     Current Outpatient Prescriptions on File Prior to Visit   Medication Sig Dispense Refill  . ADVAIR DISKUS 250-50 MCG/DOSE AEPB INHALE 1 PUFF EVERY 12 HOURS. (RINSE, GARGLE AND SPIT AFTER EACH USE) 60 each 2  . albuterol (PROVENTIL) (2.5 MG/3ML) 0.083% nebulizer solution USE ONE VIAL IN NEBULIZER FOUR TIMES DAILY    . Albuterol Sulfate (PROAIR RESPICLICK) 108 (90 BASE) MCG/ACT AEPB Inhale 1 puff into the lungs as needed. 1 each 0  . ALPRAZolam (XANAX) 0.5 MG tablet TAKE ONE TABLET BY MOUTH EVERY TWELVE HOURS AS NEEDED 60 tablet 0  . aspirin 81 MG tablet Take 81 mg by mouth daily.    Marland Kitchen. atorvastatin (LIPITOR) 20 MG tablet TAKE 1 TABLET BY MOUTH DAILY 90 tablet 3  . nitroGLYCERIN (NITROSTAT) 0.4 MG SL tablet Place 1 tablet (0.4 mg total) under the tongue every 5 (five) minutes as needed. 25 tablet 0  . PROAIR HFA 108 (90 BASE) MCG/ACT inhaler INHALE TWO PUFFS EVERY 6 HOURS AS NEEDED FOR WHEEZING 8.5 g 4  . SPIRIVA HANDIHALER 18 MCG inhalation capsule INHALE CONTENTS OF 1 CAPSULE ONCE DAILY 30 capsule 2  . tamsulosin (FLOMAX) 0.4 MG CAPS capsule Take by mouth daily.    . clopidogrel (PLAVIX) 75 MG tablet TAKE 1 TABLET BY MOUTH DAILY (Patient not taking: Reported on 06/05/2014) 30 tablet 6  . predniSONE (DELTASONE) 10 MG tablet Take 4 tablets x 4 days, 3 tablets x 4 days, 2 tablets x 4 days, 1 tablet x 4 days, then STOP (Patient not taking: Reported on 06/05/2014) 40 tablet 0   No current facility-administered medications on file prior to visit.   No Known Allergies  ROS: See HPI for pertinent positives and negatives.  Physical Examination  Filed Vitals:   06/05/14 0940  BP: 123/79  Pulse: 81  Resp: 16  Height: 5\' 4"  (1.626 m)  Weight: 97 lb (43.999 kg)  SpO2: 100%   Body mass index is 16.64 kg/(m^2).  General: A&O x 3, WD.  Pulmonary: Sym exp, little air movt, CTAB, no rales, rhonchi, or wheezing. Occasional cough, nasal canula oxygen in place.  Cardiac: RRR, Nl S1, S2, no detected murmur.   Carotid Bruits Right Left   Negative Negative    Aorta is faintly palpable Radial pulses are 2+ palpable and =                          VASCULAR EXAM:  LE Pulses Right Left       FEMORAL  1+ palpable  not palpable        POPLITEAL  not palpable   not palpable       POSTERIOR TIBIAL  1+ palpable   not palpable        DORSALIS PEDIS      ANTERIOR TIBIAL faintly palpable  not palpable      Gastrointestinal: soft, NTND, -G/R, - HSM, - masses palpated, - CVAT B.  Musculoskeletal: M/S 4/5 throughout, Extremities without ischemic changes.  Neurologic: CN 2-12 intact, Pain and light touch intact in extremities are intact, Motor exam as listed above.  Non-Invasive Vascular Imaging  AAA Duplex (06/05/2014) ABDOMINAL AORTA DUPLEX EVALUATION    INDICATION: Abdominal aortic aneurysm    PREVIOUS INTERVENTION(S):     DUPLEX EXAM:     LOCATION DIAMETER AP (cm) DIAMETER TRANSVERSE (cm) VELOCITIES (cm/sec)  Aorta Proximal 2.4 2.3 57  Aorta Mid 1.8 1.5 57  Aorta Distal 3.7 4.0 37  Right Common Iliac Artery Not Visualized  Not Visualized    Left Common Iliac Artery Not Visualized Not Visualized      Previous max aortic diameter:  4.0cm Date: 11/02/13     ADDITIONAL FINDINGS: . Limited visualization of the abdominal vasculature due to overlying bowel gas. . Mural thrombus noted in the distal aorta with no hemodynamically significant stenosis of the abdominal aorta.    IMPRESSION: Aneurysmal dilatation of the distal abdominal aorta with a maximum diameter of 4.0cm.     Compared to the previous exam:  No significant change in the abdominal aortic aneurysm when compared to the previous exam on 11/02/13 at the [facility] in [place].     Medical Decision Making  The patient is a 64 y.o. male who presents with asymptomatic AAA with no increase in size the last two Duplex studies, maximum diameter of 4.0cm by Duplex  today. Pt now has intermittent claudication with no tissue loss, will add ABI's on his return. .   Based on this patient's exam and diagnostic studies, the patient will follow up in 1 year  with the following studies: AAA Duplex and ABI's.  Consideration for repair of AAA would be made when the size approaches 4.8 or 5.0 cm, growth > 1 cm/yr, and symptomatic status.  I emphasized the importance of maximal medical management including strict control of blood pressure, blood glucose, and lipid levels, antiplatelet agents, obtaining regular exercise, and continued cessation of smoking.   The patient was given information about AAA including signs, symptoms, treatment, and how to minimize the risk of enlargement and rupture of aneurysms.    The patient was advised to call [phone] should the patient experience sudden onset abdominal or back pain.   Thank you for allowing [place] to participate in this patient's care.  Charisse March, RN, MSN, FNP-C [facility] [place] Office: [phone]  Clinic Physician: Myra Gianotti on call  06/05/2014, 10:00 AM

## 2014-06-05 NOTE — Patient Instructions (Signed)
Abdominal Aortic Aneurysm An aneurysm is a weakened or damaged part of an artery wall that bulges from the normal force of blood pumping through the body. An abdominal aortic aneurysm is an aneurysm that occurs in the lower part of the aorta, the main artery of the body.  The major concern with an abdominal aortic aneurysm is that it can enlarge and burst (rupture) or blood can flow between the layers of the wall of the aorta through a tear (aorticdissection). Both of these conditions can cause bleeding inside the body and can be life threatening unless diagnosed and treated promptly. CAUSES  The exact cause of an abdominal aortic aneurysm is unknown. Some contributing factors are:   A hardening of the arteries caused by the buildup of fat and other substances in the lining of a blood vessel (arteriosclerosis).  Inflammation of the walls of an artery (arteritis).   Connective tissue diseases, such as Marfan syndrome.   Abdominal trauma.   An infection, such as syphilis or staphylococcus, in the wall of the aorta (infectious aortitis) caused by bacteria. RISK FACTORS  Risk factors that contribute to an abdominal aortic aneurysm may include:  Age older than 60 years.   High blood pressure (hypertension).  Male gender.  Ethnicity (white race).  Obesity.  Family history of aneurysm (first degree relatives only).  Tobacco use. PREVENTION  The following healthy lifestyle habits may help decrease your risk of abdominal aortic aneurysm:  Quitting smoking. Smoking can raise your blood pressure and cause arteriosclerosis.  Limiting or avoiding alcohol.  Keeping your blood pressure, blood sugar level, and cholesterol levels within normal limits.  Decreasing your salt intake. In somepeople, too much salt can raise blood pressure and increase your risk of abdominal aortic aneurysm.  Eating a diet low in saturated fats and cholesterol.  Increasing your fiber intake by including  whole grains, vegetables, and fruits in your diet. Eating these foods may help lower blood pressure.  Maintaining a healthy weight.  Staying physically active and exercising regularly. SYMPTOMS  The symptoms of abdominal aortic aneurysm may vary depending on the size and rate of growth of the aneurysm.Most grow slowly and do not have any symptoms. When symptoms do occur, they may include:  Pain (abdomen, side, lower back, or groin). The pain may vary in intensity. A sudden onset of severe pain may indicate that the aneurysm has ruptured.  Feeling full after eating only small amounts of food.  Nausea or vomiting or both.  Feeling a pulsating lump in the abdomen.  Feeling faint or passing out. DIAGNOSIS  Since most unruptured abdominal aortic aneurysms have no symptoms, they are often discovered during diagnostic exams for other conditions. An aneurysm may be found during the following procedures:  Ultrasonography (A one-time screening for abdominal aortic aneurysm by ultrasonography is also recommended for all men aged 65-75 years who have ever smoked).  X-ray exams.  A computed tomography (CT).  Magnetic resonance imaging (MRI).  Angiography or arteriography. TREATMENT  Treatment of an abdominal aortic aneurysm depends on the size of your aneurysm, your age, and risk factors for rupture. Medication to control blood pressure and pain may be used to manage aneurysms smaller than 6 cm. Regular monitoring for enlargement may be recommended by your caregiver if:  The aneurysm is 3-4 cm in size (an annual ultrasonography may be recommended).  The aneurysm is 4-4.5 cm in size (an ultrasonography every 6 months may be recommended).  The aneurysm is larger than 4.5 cm in   size (your caregiver may ask that you be examined by a vascular surgeon). If your aneurysm is larger than 6 cm, surgical repair may be recommended. There are two main methods for repair of an aneurysm:   Endovascular  repair (a minimally invasive surgery). This is done most often.  Open repair. This method is used if an endovascular repair is not possible. Document Released: 11/13/2004 Document Revised: 05/31/2012 Document Reviewed: 03/05/2012 ExitCare Patient Information 2015 [employer]. This information is not intended to replace advice given to you by your health care provider. Make sure you discuss any questions you have with your health care provider.  

## 2014-06-06 ENCOUNTER — Inpatient Hospital Stay: Admission: RE | Admit: 2014-06-06 | Payer: Medicare HMO | Source: Ambulatory Visit

## 2014-06-06 ENCOUNTER — Telehealth: Payer: Self-pay | Admitting: Pulmonary Disease

## 2014-06-06 DIAGNOSIS — J441 Chronic obstructive pulmonary disease with (acute) exacerbation: Secondary | ICD-10-CM

## 2014-06-06 NOTE — Telephone Encounter (Signed)
Patient notified.  CT ordered for 03/2015.  Nothing further needed.

## 2014-06-06 NOTE — Telephone Encounter (Signed)
Spoke with the pt  He states that he already had CT Chest done in Feb 2016 while [facility]  He states that he can repeat it again if RA wants  I printed report from the PACS system and have placed in RA's lookat for review  Please advise if he needs to repeat CT, thanks

## 2014-06-06 NOTE — Telephone Encounter (Signed)
I reviewed - old nodules stable New pneumonia vs scarring Does not need scan this year- FU scan in 1 yr advised 03/2015

## 2014-06-07 ENCOUNTER — Telehealth (HOSPITAL_COMMUNITY): Payer: Self-pay | Admitting: Family

## 2014-06-07 NOTE — Telephone Encounter (Signed)
-----   Message from Annye RuskSuzanne L Nickel, NP sent at 06/05/2014  9:25 PM EDT ----- Regarding: add ABI's on return visit [facility] scheduling and Juliette AlcideMelinda,  Since pt now has claudication symptoms and has known iliac artery stenosis, will add ABI's in addition to already scheduled AAA Duplex on his return in a year.  Thank you, Rosalita ChessmanSuzanne

## 2014-06-07 NOTE — Telephone Encounter (Signed)
Aaron [name]'s scheduled as requested.

## 2014-06-12 NOTE — Addendum Note (Signed)
Addended by: Sharee PimpleMCCHESNEY, Montia Haslip K on: 06/12/2014 04:06 PM   Modules accepted: Orders

## 2014-07-21 ENCOUNTER — Encounter: Payer: Self-pay | Admitting: *Deleted

## 2014-07-25 ENCOUNTER — Encounter: Payer: Self-pay | Admitting: Cardiovascular Disease

## 2014-07-25 ENCOUNTER — Ambulatory Visit: Payer: Medicare HMO | Admitting: Cardiovascular Disease

## 2014-10-06 ENCOUNTER — Ambulatory Visit: Payer: Medicare HMO | Admitting: Cardiovascular Disease

## 2014-10-20 ENCOUNTER — Other Ambulatory Visit: Payer: Self-pay | Admitting: Cardiovascular Disease

## 2014-11-27 ENCOUNTER — Ambulatory Visit: Payer: Medicare HMO | Admitting: Cardiovascular Disease

## 2014-12-20 ENCOUNTER — Inpatient Hospital Stay (HOSPITAL_COMMUNITY): Payer: Medicare HMO

## 2014-12-20 ENCOUNTER — Inpatient Hospital Stay (HOSPITAL_COMMUNITY)
Admission: EM | Admit: 2014-12-20 | Discharge: 2015-01-18 | DRG: 871 | Disposition: E | Payer: Medicare HMO | Source: Other Acute Inpatient Hospital | Attending: Pulmonary Disease | Admitting: Pulmonary Disease

## 2014-12-20 DIAGNOSIS — I252 Old myocardial infarction: Secondary | ICD-10-CM

## 2014-12-20 DIAGNOSIS — E875 Hyperkalemia: Secondary | ICD-10-CM | POA: Diagnosis not present

## 2014-12-20 DIAGNOSIS — I2510 Atherosclerotic heart disease of native coronary artery without angina pectoris: Secondary | ICD-10-CM | POA: Diagnosis present

## 2014-12-20 DIAGNOSIS — J449 Chronic obstructive pulmonary disease, unspecified: Secondary | ICD-10-CM | POA: Diagnosis not present

## 2014-12-20 DIAGNOSIS — Z955 Presence of coronary angioplasty implant and graft: Secondary | ICD-10-CM

## 2014-12-20 DIAGNOSIS — Y95 Nosocomial condition: Secondary | ICD-10-CM | POA: Diagnosis not present

## 2014-12-20 DIAGNOSIS — R739 Hyperglycemia, unspecified: Secondary | ICD-10-CM | POA: Diagnosis not present

## 2014-12-20 DIAGNOSIS — A419 Sepsis, unspecified organism: Secondary | ICD-10-CM | POA: Diagnosis present

## 2014-12-20 DIAGNOSIS — Z7902 Long term (current) use of antithrombotics/antiplatelets: Secondary | ICD-10-CM

## 2014-12-20 DIAGNOSIS — Z7982 Long term (current) use of aspirin: Secondary | ICD-10-CM

## 2014-12-20 DIAGNOSIS — E785 Hyperlipidemia, unspecified: Secondary | ICD-10-CM | POA: Diagnosis not present

## 2014-12-20 DIAGNOSIS — J15212 Pneumonia due to Methicillin resistant Staphylococcus aureus: Secondary | ICD-10-CM | POA: Diagnosis present

## 2014-12-20 DIAGNOSIS — Z66 Do not resuscitate: Secondary | ICD-10-CM | POA: Diagnosis present

## 2014-12-20 DIAGNOSIS — R001 Bradycardia, unspecified: Secondary | ICD-10-CM | POA: Diagnosis not present

## 2014-12-20 DIAGNOSIS — E872 Acidosis: Secondary | ICD-10-CM | POA: Diagnosis present

## 2014-12-20 DIAGNOSIS — R6521 Severe sepsis with septic shock: Secondary | ICD-10-CM | POA: Diagnosis present

## 2014-12-20 DIAGNOSIS — J9601 Acute respiratory failure with hypoxia: Secondary | ICD-10-CM | POA: Diagnosis present

## 2014-12-20 DIAGNOSIS — J45909 Unspecified asthma, uncomplicated: Secondary | ICD-10-CM | POA: Diagnosis present

## 2014-12-20 DIAGNOSIS — G9340 Encephalopathy, unspecified: Secondary | ICD-10-CM | POA: Diagnosis not present

## 2014-12-20 DIAGNOSIS — I10 Essential (primary) hypertension: Secondary | ICD-10-CM | POA: Diagnosis present

## 2014-12-20 DIAGNOSIS — I255 Ischemic cardiomyopathy: Secondary | ICD-10-CM | POA: Diagnosis not present

## 2014-12-20 DIAGNOSIS — Z87891 Personal history of nicotine dependence: Secondary | ICD-10-CM | POA: Diagnosis not present

## 2014-12-20 DIAGNOSIS — I739 Peripheral vascular disease, unspecified: Secondary | ICD-10-CM | POA: Diagnosis not present

## 2014-12-20 DIAGNOSIS — Z01818 Encounter for other preprocedural examination: Secondary | ICD-10-CM

## 2014-12-20 LAB — CBC WITH DIFFERENTIAL/PLATELET
BASOS PCT: 1 %
Basophils Absolute: 0 10*3/uL (ref 0.0–0.1)
EOS ABS: 0 10*3/uL (ref 0.0–0.7)
Eosinophils Relative: 1 %
HCT: 34.8 % — ABNORMAL LOW (ref 39.0–52.0)
HEMOGLOBIN: 10.9 g/dL — AB (ref 13.0–17.0)
LYMPHS PCT: 18 %
Lymphs Abs: 0.8 10*3/uL (ref 0.7–4.0)
MCH: 28.8 pg (ref 26.0–34.0)
MCHC: 31.3 g/dL (ref 30.0–36.0)
MCV: 91.8 fL (ref 78.0–100.0)
MONOS PCT: 6 %
Monocytes Absolute: 0.3 10*3/uL (ref 0.1–1.0)
NEUTROS ABS: 3.6 10*3/uL (ref 1.7–7.7)
Neutrophils Relative %: 74 %
Platelets: 199 10*3/uL (ref 150–400)
RBC: 3.79 MIL/uL — ABNORMAL LOW (ref 4.22–5.81)
RDW: 15.2 % (ref 11.5–15.5)
WBC: 4.7 10*3/uL (ref 4.0–10.5)

## 2014-12-20 LAB — COMPREHENSIVE METABOLIC PANEL
ALT: 24 U/L (ref 17–63)
ANION GAP: 8 (ref 5–15)
AST: 51 U/L — ABNORMAL HIGH (ref 15–41)
Albumin: 1.1 g/dL — ABNORMAL LOW (ref 3.5–5.0)
Alkaline Phosphatase: 42 U/L (ref 38–126)
BUN: 23 mg/dL — ABNORMAL HIGH (ref 6–20)
CHLORIDE: 105 mmol/L (ref 101–111)
CO2: 24 mmol/L (ref 22–32)
CREATININE: 0.98 mg/dL (ref 0.61–1.24)
Calcium: 5.2 mg/dL — CL (ref 8.9–10.3)
Glucose, Bld: 374 mg/dL — ABNORMAL HIGH (ref 65–99)
Potassium: 4.6 mmol/L (ref 3.5–5.1)
Sodium: 137 mmol/L (ref 135–145)
Total Bilirubin: 0.4 mg/dL (ref 0.3–1.2)
Total Protein: <3 g/dL — ABNORMAL LOW (ref 6.5–8.1)

## 2014-12-20 LAB — BLOOD GAS, ARTERIAL
Acid-base deficit: 11.4 mmol/L — ABNORMAL HIGH (ref 0.0–2.0)
Bicarbonate: 18 mEq/L — ABNORMAL LOW (ref 20.0–24.0)
Drawn by: 24487
FIO2: 1
LHR: 14 {breaths}/min
MECHVT: 500 mL
O2 SAT: 93.9 %
PATIENT TEMPERATURE: 98.6
PEEP: 5 cmH2O
TCO2: 20.3 mmol/L (ref 0–100)
pCO2 arterial: 74.7 mmHg (ref 35.0–45.0)
pH, Arterial: 7.012 — CL (ref 7.350–7.450)
pO2, Arterial: 94.8 mmHg (ref 80.0–100.0)

## 2014-12-20 LAB — PROTIME-INR
INR: 2.71 — ABNORMAL HIGH (ref 0.00–1.49)
Prothrombin Time: 28.3 seconds — ABNORMAL HIGH (ref 11.6–15.2)

## 2014-12-20 LAB — PROCALCITONIN

## 2014-12-20 LAB — PHOSPHORUS: PHOSPHORUS: 6.8 mg/dL — AB (ref 2.5–4.6)

## 2014-12-20 LAB — LACTIC ACID, PLASMA: Lactic Acid, Venous: 4.7 mmol/L (ref 0.5–2.0)

## 2014-12-20 LAB — MRSA PCR SCREENING: MRSA BY PCR: NEGATIVE

## 2014-12-20 LAB — MAGNESIUM: MAGNESIUM: 1.2 mg/dL — AB (ref 1.7–2.4)

## 2014-12-20 LAB — GLUCOSE, CAPILLARY: Glucose-Capillary: 336 mg/dL — ABNORMAL HIGH (ref 65–99)

## 2014-12-20 LAB — CORTISOL: Cortisol, Plasma: 12 ug/dL

## 2014-12-20 LAB — TROPONIN I: Troponin I: 0.07 ng/mL — ABNORMAL HIGH (ref <0.031)

## 2014-12-20 MED ORDER — ASPIRIN 81 MG PO CHEW: 81.0000 mg | CHEWABLE_TABLET | Freq: Every day | ORAL | Status: DC

## 2014-12-20 MED ORDER — HYDROCORTISONE NA SUCCINATE PF 100 MG IJ SOLR
50.0000 mg | Freq: Four times a day (QID) | INTRAMUSCULAR | Status: DC
  Administered 2014-12-20: 50 mg via INTRAVENOUS
  Filled 2014-12-20: qty 2
  Filled 2014-12-20 (×4): qty 1

## 2014-12-20 MED ORDER — ATROPINE SULFATE 0.1 MG/ML IJ SOLN
INTRAMUSCULAR | Status: AC
  Filled 2014-12-20: qty 10

## 2014-12-20 MED ORDER — IPRATROPIUM-ALBUTEROL 0.5-2.5 (3) MG/3ML IN SOLN: 3.0000 mL | Freq: Four times a day (QID) | RESPIRATORY_TRACT | Status: DC

## 2014-12-20 MED ORDER — SODIUM CHLORIDE 0.9 % IV SOLN
25.0000 ug/h | INTRAVENOUS | Status: DC
  Administered 2014-12-20: 50 ug/h via INTRAVENOUS
  Filled 2014-12-20: qty 50

## 2014-12-20 MED ORDER — OSELTAMIVIR PHOSPHATE 75 MG PO CAPS
75.0000 mg | ORAL_CAPSULE | Freq: Two times a day (BID) | ORAL | Status: DC
  Filled 2014-12-20 (×3): qty 1

## 2014-12-20 MED ORDER — SODIUM CHLORIDE 0.9 % IV SOLN: 250.0000 mL | INTRAVENOUS | Status: DC | PRN

## 2014-12-20 MED ORDER — DEXTROSE 5 % IV SOLN
0.0000 ug/min | INTRAVENOUS | Status: DC
  Administered 2014-12-20: 400 ug/min via INTRAVENOUS
  Filled 2014-12-20: qty 4

## 2014-12-20 MED ORDER — PIPERACILLIN-TAZOBACTAM 3.375 G IVPB
3.3750 g | Freq: Three times a day (TID) | INTRAVENOUS | Status: DC
  Administered 2014-12-20: 3.375 g via INTRAVENOUS
  Filled 2014-12-20 (×2): qty 50

## 2014-12-20 MED ORDER — ARFORMOTEROL TARTRATE 15 MCG/2ML IN NEBU
15.0000 ug | INHALATION_SOLUTION | Freq: Two times a day (BID) | RESPIRATORY_TRACT | Status: DC
  Filled 2014-12-20 (×2): qty 2

## 2014-12-20 MED ORDER — MIDAZOLAM HCL 2 MG/2ML IJ SOLN: 2.0000 mg | INTRAMUSCULAR | Status: DC | PRN

## 2014-12-20 MED ORDER — BUDESONIDE 0.5 MG/2ML IN SUSP
0.5000 mg | Freq: Two times a day (BID) | RESPIRATORY_TRACT | Status: DC
  Filled 2014-12-20: qty 4

## 2014-12-20 MED ORDER — SODIUM CHLORIDE 0.9 % IV SOLN: INTRAVENOUS | Status: DC | PRN

## 2014-12-20 MED ORDER — HEPARIN SODIUM (PORCINE) 5000 UNIT/ML IJ SOLN
5000.0000 [IU] | Freq: Three times a day (TID) | INTRAMUSCULAR | Status: DC
  Filled 2014-12-20 (×3): qty 1

## 2014-12-20 MED ORDER — VANCOMYCIN HCL IN DEXTROSE 750-5 MG/150ML-% IV SOLN
750.0000 mg | INTRAVENOUS | Status: DC
  Filled 2014-12-20: qty 150

## 2014-12-20 MED ORDER — PHENYLEPHRINE HCL 10 MG/ML IJ SOLN: 30.0000 ug/min | INTRAMUSCULAR | Status: DC

## 2014-12-20 MED ORDER — NOREPINEPHRINE BITARTRATE 1 MG/ML IV SOLN: 2.0000 ug/min | INTRAVENOUS | Status: DC

## 2014-12-20 MED ORDER — DEXTROSE 5 % IV SOLN
0.0000 ug/min | INTRAVENOUS | Status: DC
  Administered 2014-12-20: 40 ug/min via INTRAVENOUS
  Filled 2014-12-20: qty 16

## 2014-12-20 MED ORDER — VASOPRESSIN 20 UNIT/ML IV SOLN
0.0300 [IU]/min | INTRAVENOUS | Status: DC
  Administered 2014-12-20: 0.03 [IU]/min via INTRAVENOUS
  Filled 2014-12-20: qty 2

## 2014-12-20 MED ORDER — SODIUM CHLORIDE 0.9 % IV SOLN
INTRAVENOUS | Status: DC
  Administered 2014-12-20: 01:00:00 via INTRAVENOUS

## 2014-12-20 MED ORDER — CLOPIDOGREL BISULFATE 75 MG PO TABS
75.0000 mg | ORAL_TABLET | Freq: Every day | ORAL | Status: DC
  Filled 2014-12-20: qty 1

## 2014-12-20 MED ORDER — ATORVASTATIN CALCIUM 20 MG PO TABS
20.0000 mg | ORAL_TABLET | Freq: Every day | ORAL | Status: DC
  Filled 2014-12-20: qty 1

## 2014-12-20 MED ORDER — SODIUM BICARBONATE 8.4 % IV SOLN
INTRAVENOUS | Status: DC
  Filled 2014-12-20 (×2): qty 150

## 2014-12-20 MED ORDER — ALBUTEROL SULFATE (2.5 MG/3ML) 0.083% IN NEBU: 2.5000 mg | INHALATION_SOLUTION | RESPIRATORY_TRACT | Status: DC | PRN

## 2014-12-20 MED ORDER — FENTANYL BOLUS VIA INFUSION
50.0000 ug | INTRAVENOUS | Status: DC | PRN
  Filled 2014-12-20: qty 50

## 2014-12-20 MED ORDER — FENTANYL CITRATE (PF) 100 MCG/2ML IJ SOLN: 50.0000 ug | Freq: Once | INTRAMUSCULAR | Status: AC

## 2014-12-20 MED ORDER — PANTOPRAZOLE SODIUM 40 MG IV SOLR
40.0000 mg | Freq: Every day | INTRAVENOUS | Status: DC
  Administered 2014-12-20: 40 mg via INTRAVENOUS
  Filled 2014-12-20 (×2): qty 40

## 2014-12-21 ENCOUNTER — Telehealth: Payer: Self-pay

## 2014-12-21 NOTE — Telephone Encounter (Signed)
On 12/21/2014 I received a death certificate from [employer]. The death certificate is for cremation. The patient is a patient of Doctor Vassie Lolllva. The death certificate will be taken to [facility] ICU unit Monday for signature. On 12/25/2014 I received the death certificate back from Doctor Craige CottaSood who signed the death certificate for Doctor Vassie LollAlva. I faxed the death certificate to the funeral home per their request and mailed the original [facility] per their request.

## 2014-12-22 ENCOUNTER — Telehealth: Payer: Self-pay

## 2014-12-22 NOTE — Telephone Encounter (Signed)
On 12/22/2014 I received the original death certificate from [employer] [employer]. The death certificate is for cremation. The patient is a patient of Doctor Vassie Lolllva. The death certificate will be taken to [facility] (icu [facility] on Monday for signature. On 12/25/2014 I received the death certificate back from Doctor Craige CottaSood who signed the death certificate for Doctor Vassie LollAlva. I faxed the death certificate to the funeral home and [facility] per their request.

## 2014-12-23 LAB — CULTURE, RESPIRATORY W GRAM STAIN

## 2014-12-25 LAB — CULTURE, BLOOD (ROUTINE X 2)
CULTURE: NO GROWTH
Culture: NO GROWTH

## 2015-01-18 NOTE — Procedures (Signed)
Extubation Procedure Note  Patient Details:   Name: Aaron [name] DOB: 1950/03/19 MRN: [id number]   Airway Documentation:  Airway 7 mm (Active)  Secured at (cm) 25 cm 01/15/2015 12:13 AM  Measured From Lips 12/24/2014 12:13 AM  Secured Location Right 01/17/2015 12:13 AM  Secured By [employer] 01/06/2015 12:13 AM  Site Condition Cool;Dry 12/29/2014 12:13 AM    Evaluation  O2 sats: deceased Complications: No apparent complications Patient decesed  Bilateral Breath Sounds: Clear, Diminished   No  [name], [name] 12/19/2014, 2:47 AM

## 2015-01-18 NOTE — Progress Notes (Signed)
   08-May-2014 0200  Clinical Encounter Type  Visited With Family  Visit Type Death  Referral From Nurse  Spiritual Encounters  Spiritual Needs Grief support;Emotional  CH responded to page to comfort family after death of pt; family at bedside of pt; CH indicated that family able to stay and that [facility] and offer any support.  Grief and emotional support offered to family.  2:59 AM Erline LevineMichael I Bell Cai

## 2015-01-18 NOTE — Progress Notes (Addendum)
Patient had episode of bradycardia HR down to the 20's-30's but non sustained. Family was called at the bedside explained and updated about patients heart rate. R. DesaiNP  In and preparing to placed arterial line since RT was unsuccessful . Patient started HR slowing down from 40's-20's then asystole. No pulse noted, R. DesaiNP present and checked for pulse no pulse, no heart tones as well per auscultation,  ponounced  Dead at 0239. Family at the bedside, emotional support given.

## 2015-01-18 NOTE — Progress Notes (Signed)
ANTIBIOTIC CONSULT NOTE - INITIAL  Pharmacy Consult for Vancomycin and Zosyn Indication: sepsis due to HCAP  No Known Allergies  Patient Measurements:    Ht: 64 in  Wt: ~44 kg  Vital Signs: Temp: 97.4 F (36.3 C) (11/02 0022) Temp Source: Oral (11/02 0022) BP: 60/42 mmHg (11/02 0115) Pulse Rate: 126 (11/02 0115) Intake/Output from previous day:   Intake/Output from this shift:    Labs: No results for input(s): WBC, HGB, PLT, LABCREA, CREATININE in the last 72 hours. CrCl cannot be calculated (Unknown ideal weight.). No results for input(s): VANCOTROUGH, VANCOPEAK, VANCORANDOM, GENTTROUGH, GENTPEAK, GENTRANDOM, TOBRATROUGH, TOBRAPEAK, TOBRARND, AMIKACINPEAK, AMIKACINTROU, AMIKACIN in the last 72 hours.   Microbiology: No results found for this or any previous visit (from the past 720 hour(s)).  Medical History: Past Medical History  Diagnosis Date  . COPD (chronic obstructive pulmonary disease)     severe; bullos emphysema. longstanding tobacco smoking   . Tobacco abuse   . Pulmonary nodule   . History of heart attack   . Hypertension   . Asthma   . PVD (peripheral vascular disease)     Fusiform distal AAA, with moderate thrombus (3.7 cm x 3.2 cm x 5.1 cm long); moderate aortoiliac atherosclerosis, 05/2012;LLE claudication with 0.90 ABI; 0.94 on rt, 06/2007  . CAD (coronary artery disease)     severe single-vessel coronary arteries w/mildly reduced EF 45% sginif. for her wall motion abnml. chronically occluded rt coronary artery.   . Ischemic cardiomyopathy     Dobutamine stress CL, 09/2011: No large areas of ischemia; EF 59%; severe; EF 40-45% w/inferior apical akenesis by 2D echo 4/09. EF 30-35% by cardiac cath in '04. s/p MSTEMI/bare metal stenting of prximal LAD 10/04. residual, chronic 100% distal RCA stenosis. s/p MI/PCI  in [id number]; HRSA/stenting in 1996 [facility]  . VT (ventricular tachycardia)     hx of nonsustained; noninducible electophysiology study in 2004  .  Orthostatic hypotension     hx  . Dyslipidemia     statin intolerant  . Myocardial infarction (HCC)     4 Heart Attack    Medications:  Awaiting med rec  Assessment: 64 y.o. male [facility] with septic shock due to HCAP. [facility]. Recent hospitalization 10/26-10/28 for CAP. Pt received Vancomycin 1gm and Zosyn 3.375gm ~1400 and Levaquin 750mg  IV ~1745 [facility]. Pt to continue Vancomycin and Zosyn for HCAP. Also started on Tamiflu.  [facility]: Na 145, K 6, Cl 103, Bicarb 22.9, SCr 1.39 (est CrCl 35 ml/min), BUN 32, LA 11.1, WBC 24.1  Goal of Therapy:  Vancomycin trough level 15-20 mcg/ml  Plan:  Zosyn 3.375gm IV q8h - doses over 4 hours Vancomycin 750mg  IV q24h Will f/u micro data, renal function, and pt's clinical condition Vanc trough prn F/u current weight  Christoper Fabianaron Ngan Qualls, PharmD, BCPS Clinical pharmacist, pager [phone] 01/03/2015,1:28 AM

## 2015-01-18 NOTE — Progress Notes (Signed)
PCCM Interval Progress Note  RT had attempted radial arterial line placement without success.  I was called to bedside to place femoral arterial line given persistent shock state (SBP 60's via cuff).  As I was prepping the right femoral site, pt began to brady down.  Procedure terminated and family immediately called into room.  Unfortunately, pt then transitioned into asystole.  No heart tones were appreciated via auscultation.  Pt pronounced at 0239.  Chaplain called and emotional support offered to family.   Rutherford Guysahul Sherby Moncayo, [place] - C [place] [facility] Pager: [phone]  or [phone] 01/11/2015, 2:48 AM

## 2015-01-18 NOTE — H&P (Signed)
PULMONARY / CRITICAL CARE MEDICINE   Name: Aaron [name] MRN: [id number] DOB: 09-07-1950    ADMISSION DATE:  01-09-15 CONSULTATION DATE:  Jan 09, 2015  REFERRING MD :  [facility]  CHIEF COMPLAINT:  Unresponsive  INITIAL PRESENTATION:  64 y.o. M transferred from [facility] 11/2 with septic shock due to HCAP.    STUDIES:  CXR 11/2 >>>   SIGNIFICANT EVENTS: 11/2 - transferred from [facility] for septic shock due to HCAP.   HISTORY OF PRESENT ILLNESS:  Pt is encephalopathic; therefore, this HPI is obtained from chart review. Aaron [name] is a 64 y.o. M with PMH as outlined below including severe COPD (followed by Dr. Vassie Loll).  He was admitted at [facility] 12/13/14 through 12/15/14 for CAP.  Per his son and two daughters, he returned home and did well over the weekend.  On Monday night 10/31, he had some right rib cage pain and nausea and thought that he had the GI bug.  On Tuesday morning 11/1, symptoms continued and he asked son to help him to the restroom where he proceeded to have 2 episodes of diarrhea along with 4 - 5 episodes of vomiting.  He then began to experience chills and fatigue so asked his son to help him into bed.  His son states that he did so and approx 15 minutes later, son heard a loud noise.  When son went into pt's bedroom, he noticed that everything was knocked off the night stand, and pt was on the ground unresponsive.  He was breathing spontaneously but skin color was very pale.  EMS was dispatched and upon their arrival, SpO2 was in 70's and BP was 42/30.  He was susbequently intubated for respiratory insufficiency.  He was taken to [facility] where CXR showed worsening of his right sided PNA.  In addition, he was profoundly hypotensive with SBP's in the 40's at one point.  He was subsequently placed on vasopressors.  At one point, RN could not hear heart tones via auscultation, therefore, compressions were started.  After a few seconds, pt spontaneously moved his  right arm so compressions were stopped.  His core temp was noted to be 93.4 F.  Pt was transferred to [facility] for further evaluation and management.  Significant labs at [facility] include ABG 6.97 / 81 / 183, K 6.0, AG 25, SCr 1.39, Glucose 6, Lactic Acid 11.1, WBC 24.1,   Of note, after speaking with son and two daughters, pt had previously stated that he was afraid of being intubated and he would not such a thing.  I had long discussion with family regarding pt's current circumstances / organ failures / etc.  They felt that since pt is already intubated, we should continue with aggressive medical care with the understanding that we would not perform resuscitation in the event that arrest were to occur.   PAST MEDICAL HISTORY :   has a past medical history of COPD (chronic obstructive pulmonary disease); Tobacco abuse; Pulmonary nodule; History of heart attack; Hypertension; Asthma; PVD (peripheral vascular disease); CAD (coronary artery disease); Ischemic cardiomyopathy; VT (ventricular tachycardia); Orthostatic hypotension; Dyslipidemia; and Myocardial infarction (HCC).  has past surgical history that includes Angioplasty (2004); Coronary stent placement; and Upper gi endoscopy (March 2016). Prior to Admission medications   Medication Sig Start Date End Date Taking? Authorizing Provider  ADVAIR DISKUS 250-50 MCG/DOSE AEPB INHALE 1 PUFF EVERY 12 HOURS. (RINSE, GARGLE AND SPIT AFTER EACH USE) 05/19/14   Oretha Milch, MD  albuterol (PROVENTIL) (2.5 MG/3ML)  0.083% nebulizer solution USE ONE VIAL IN NEBULIZER FOUR TIMES DAILY 03/22/13   Oretha Milch, MD  Albuterol Sulfate (PROAIR RESPICLICK) 108 (90 BASE) MCG/ACT AEPB Inhale 1 puff into the lungs as needed. 03/03/14   Oretha Milch, MD  ALPRAZolam Prudy Feeler) 0.5 MG tablet TAKE ONE TABLET BY MOUTH EVERY TWELVE HOURS AS NEEDED    Oretha Milch, MD  aspirin 81 MG tablet Take 81 mg by mouth daily.    Historical Provider, MD  atorvastatin (LIPITOR) 20 MG tablet TAKE 1  TABLET BY MOUTH EVERY DAY 10/20/14   Laqueta Linden, MD  clopidogrel (PLAVIX) 75 MG tablet TAKE 1 TABLET BY MOUTH DAILY Patient not taking: Reported on 06/05/2014 10/25/13   Laqueta Linden, MD  ferrous sulfate 325 (65 FE) MG tablet Take 325 mg by mouth daily with breakfast.    Historical Provider, MD  nitroGLYCERIN (NITROSTAT) 0.4 MG SL tablet Place 1 tablet (0.4 mg total) under the tongue every 5 (five) minutes as needed. 06/07/13   Laqueta Linden, MD  pantoprazole (PROTONIX) 40 MG tablet daily. 05/19/14   Historical Provider, MD  predniSONE (DELTASONE) 10 MG tablet Take 4 tablets x 4 days, 3 tablets x 4 days, 2 tablets x 4 days, 1 tablet x 4 days, then STOP Patient not taking: Reported on 06/05/2014 03/13/14   Oretha Milch, MD  PROAIR HFA 108 (90 BASE) MCG/ACT inhaler INHALE TWO PUFFS EVERY 6 HOURS AS NEEDED FOR WHEEZING 08/12/12   Oretha Milch, MD  SPIRIVA HANDIHALER 18 MCG inhalation capsule INHALE CONTENTS OF 1 CAPSULE ONCE DAILY 05/19/14   Oretha Milch, MD  tamsulosin (FLOMAX) 0.4 MG CAPS capsule Take by mouth daily. 02/28/14   Historical Provider, MD   No Known Allergies  FAMILY HISTORY:  Family History  Problem Relation Age of Onset  . Emphysema Brother   . Hypertension Brother   . Lung cancer Father     SOCIAL HISTORY:  reports that he quit smoking about 10 months ago. His smoking use included Cigarettes. He started smoking about 54 years ago. He has a 48 pack-year smoking history. He has never used smokeless tobacco. He reports that he drinks about 1.2 oz of alcohol per week. He reports that he does not use illicit drugs.  REVIEW OF SYSTEMS:  Unable to obtain as pt is encephalopathic.  SUBJECTIVE:   VITAL SIGNS: Temp:  [97.4 F (36.3 C)] 97.4 F (36.3 C) (11/02 0022) FiO2 (%):  [100 %] 100 % (11/02 0013) HEMODYNAMICS:   VENTILATOR SETTINGS: Vent Mode:  [-] PRVC FiO2 (%):  [100 %] 100 % Set Rate:  [14 bmp-24 bmp] 24 bmp Vt Set:  [500 mL] 500 mL PEEP:  [5 cmH20] 5  cmH20 INTAKE / OUTPUT: Intake/Output    None     PHYSICAL EXAMINATION: General: Adult male, critically ill. Neuro: Sedated, non-responsive. HEENT: [place]/AT. PERRL, sclerae anicteric. Cardiovascular: RRR, no M/R/G.  Lungs: Respirations shallow and unlabored.  Coarse bilaterally. Abdomen: BS x 4, soft, NT/ND.  Musculoskeletal: No gross deformities, no edema.  Skin: Mottled throughout.  Extremities cool.  LABS:  CBC No results for input(s): WBC, HGB, HCT, PLT in the last 168 hours. Coag's No results for input(s): APTT, INR in the last 168 hours. BMET No results for input(s): NA, K, CL, CO2, BUN, CREATININE, GLUCOSE in the last 168 hours. Electrolytes No results for input(s): CALCIUM, MG, PHOS in the last 168 hours. Sepsis Markers No results for input(s): LATICACIDVEN, PROCALCITON, O2SATVEN in the  last 168 hours. ABG  Recent Labs Lab 07-10-2014 0028  PHART 7.012*  PCO2ART 74.7*  PO2ART 94.8   Liver Enzymes No results for input(s): AST, ALT, ALKPHOS, BILITOT, ALBUMIN in the last 168 hours. Cardiac Enzymes No results for input(s): TROPONINI, PROBNP in the last 168 hours. Glucose  Recent Labs Lab 07-10-2014 0026  GLUCAP 336*    Imaging No results found.   ASSESSMENT / PLAN:  PULMONARY OETT 11/1 >>> A: Acute hypoxic respiratory failure requiring intubation. HCAP. Possible influenza - had viral prodrome prior to this episode. COPD without exacerbation - GOLD 3 (FEV1 31%), follows with Dr. Vassie LollAlva. Former smoker. P:   Full mechanical support, wean as able. ABG noted - increase MV. VAP prevention measures. No SBT in AM given high pressor requirements. Abx, antivirals and cultures per ID section. DuoNebs in lieu of outpatient Spiriva. Budesonide / Arformoterol in lieu of outpatient Advair. Albuterol PRN. CXR in AM.  CARDIOVASCULAR CVL R IJ 11/2 >>> A:  Septic shock - due to HCAP. Hx MI x 4, CAD s/p PCI, HTN, HLD, PVD, ICM. P:  Levophed gtt, Neosynephrine gtt,  Vasopressin gtt. Goal MAP > 65. Goal CVP 8 - 12. Trend troponin / lactate. Check cortisol. Stress roids, can d/c if cortisol > 20. TTE.  RENAL A:   Hyperkalemia - s/p [facility]. AG metabolic acidosis - lactate. P:   NS @ 100. CMP now. BMP in AM.  GASTROINTESTINAL A:   GI prophylaxis. Nutrition. P:   SUP: Pantoprazole. NPO.  HEMATOLOGIC A:   VTE Prophylaxis. P:  SCD's / Heparin. CBC in AM.  INFECTIOUS A:   Septic shock - due to HCAP. Possible influenza - had viral prodrome prior to this episode. P:   BCx2 11/2 >>> UCx 11/2 >>> Sputum Cx 11/2 >>> Abx: Vanc, start date 11/2, day 1/x. Abx: Zosyn, start date 11/2, day 1/x. Antiviral:  Tamiflu, start date 11/2, day 1/x. PCT algorithm to limit abx exposure.  ENDOCRINE A:   Hyperglycemia - after [facility] D[facility]. P:   Monitor glucose on BMP.  NEUROLOGIC A:   Acute encephalopathy due to sedation. P:   Sedation:  Fentanyl gtt / Midazolam PRN. RASS goal: 0 to -1. Daily WUA.   Family updated: Son and two daughters.  Interdisciplinary Family Meeting v Palliative Care Meeting:  Due by: 11/8.   Rutherford Guysahul Desai, [place]PA - C [place] [facility] Pager: [phone]  or [phone] 01/13/2015, 1:02 AM

## 2015-01-18 DEATH — deceased

## 2015-02-18 NOTE — Discharge Summary (Signed)
Aaron [name] was a 65 y.o. male was [facility] through 12/15/14 for CAP. Per his son and two daughters, he returned home and did well over the weekend. On Monday night 10/31, he had some right rib cage pain and nausea and thought that he had the GI bug. On Tuesday morning 11/1, symptoms continued and he asked son to help him to the restroom where he proceeded to have 2 episodes of diarrhea along with 4 - 5 episodes of vomiting. He then began to experience chills and fatigue so asked his son to help him into bed. His son states that he did so and approx 15 minutes later, son heard a loud noise.  When son went into pt's bedroom, he noticed that everything was knocked off the night stand, and pt was on the ground unresponsive. He was breathing spontaneously but skin color was very pale. EMS was dispatched and upon their arrival, SpO2 was in 70's and BP was 42/30. He was susbequently intubated for respiratory insufficiency.  [facility] where CXR showed worsening of his right sided PNA. In addition, he was profoundly hypotensive with SBP's in the 40's at one point. He was subsequently placed on vasopressors. At one point, RN could not hear heart tones via auscultation, therefore, compressions were started. After a few seconds, pt spontaneously moved his right arm so compressions were stopped. His core temp was noted to be 93.4 F.  [facility] and [facility]. Significant labs at [facility] include ABG 6.97 / 81 / 183, K 6.0, AG 25, SCr 1.39, Glucose 6, Lactic Acid 11.1, WBC 24.1.  [facility]ly [facility] he developed bradycardia leading to asystole.  He was pronounced on 02-Aug-2014 at 2:39 AM.   Final Diagnoses: Acute hypoxic respiratory failure MRSA HCAP GOLD C COPD Septic shock Adrenal insufficiency Hx of CAD, HTN, PAD Chronic systolic heart failure with ischemic cardiomyopathy Hyperkalemia Metabolic  acidosis Lactic acidosis Hyperglycemia Acute metabolic encephalopathy Asystolic cardiac arrest  Coralyn HellingVineet Suzanne Garbers, MD [facility] 01/23/2015, 11:57 AM

## 2015-06-11 ENCOUNTER — Ambulatory Visit: Payer: Medicare HMO | Admitting: Family

## 2015-06-11 ENCOUNTER — Encounter (HOSPITAL_COMMUNITY): Payer: Medicare HMO

## 2015-06-11 ENCOUNTER — Other Ambulatory Visit (HOSPITAL_COMMUNITY): Payer: Medicare HMO
